# Patient Record
Sex: Female | Born: 1950 | Race: White | Hispanic: No | Smoking: Former smoker
Health system: Southern US, Community
[De-identification: ages and names within clinical notes are randomized; demographics above are authoritative.]

## PROBLEM LIST (undated history)

## (undated) DIAGNOSIS — R42 Dizziness and giddiness: Secondary | ICD-10-CM

## (undated) DIAGNOSIS — B019 Varicella without complication: Secondary | ICD-10-CM

## (undated) DIAGNOSIS — J45909 Unspecified asthma, uncomplicated: Secondary | ICD-10-CM

## (undated) DIAGNOSIS — K259 Gastric ulcer, unspecified as acute or chronic, without hemorrhage or perforation: Secondary | ICD-10-CM

## (undated) DIAGNOSIS — R32 Unspecified urinary incontinence: Secondary | ICD-10-CM

## (undated) DIAGNOSIS — E1140 Type 2 diabetes mellitus with diabetic neuropathy, unspecified: Secondary | ICD-10-CM

## (undated) DIAGNOSIS — I10 Essential (primary) hypertension: Secondary | ICD-10-CM

## (undated) DIAGNOSIS — D510 Vitamin B12 deficiency anemia due to intrinsic factor deficiency: Secondary | ICD-10-CM

## (undated) DIAGNOSIS — Z87442 Personal history of urinary calculi: Secondary | ICD-10-CM

## (undated) DIAGNOSIS — G629 Polyneuropathy, unspecified: Secondary | ICD-10-CM

## (undated) DIAGNOSIS — E785 Hyperlipidemia, unspecified: Secondary | ICD-10-CM

## (undated) DIAGNOSIS — R011 Cardiac murmur, unspecified: Secondary | ICD-10-CM

## (undated) DIAGNOSIS — G43909 Migraine, unspecified, not intractable, without status migrainosus: Secondary | ICD-10-CM

## (undated) DIAGNOSIS — D649 Anemia, unspecified: Secondary | ICD-10-CM

## (undated) DIAGNOSIS — M1990 Unspecified osteoarthritis, unspecified site: Secondary | ICD-10-CM

## (undated) DIAGNOSIS — T753XXA Motion sickness, initial encounter: Secondary | ICD-10-CM

## (undated) DIAGNOSIS — F329 Major depressive disorder, single episode, unspecified: Secondary | ICD-10-CM

## (undated) DIAGNOSIS — N189 Chronic kidney disease, unspecified: Secondary | ICD-10-CM

## (undated) DIAGNOSIS — E039 Hypothyroidism, unspecified: Secondary | ICD-10-CM

## (undated) DIAGNOSIS — F32A Depression, unspecified: Secondary | ICD-10-CM

## (undated) DIAGNOSIS — Z8719 Personal history of other diseases of the digestive system: Secondary | ICD-10-CM

## (undated) DIAGNOSIS — E669 Obesity, unspecified: Secondary | ICD-10-CM

## (undated) DIAGNOSIS — R0989 Other specified symptoms and signs involving the circulatory and respiratory systems: Secondary | ICD-10-CM

## (undated) DIAGNOSIS — K219 Gastro-esophageal reflux disease without esophagitis: Secondary | ICD-10-CM

## (undated) DIAGNOSIS — G709 Myoneural disorder, unspecified: Secondary | ICD-10-CM

## (undated) HISTORY — DX: Hypothyroidism, unspecified: E03.9

## (undated) HISTORY — DX: Varicella without complication: B01.9

## (undated) HISTORY — DX: Hyperlipidemia, unspecified: E78.5

## (undated) HISTORY — DX: Gastro-esophageal reflux disease without esophagitis: K21.9

## (undated) HISTORY — DX: Essential (primary) hypertension: I10

## (undated) HISTORY — DX: Unspecified urinary incontinence: R32

## (undated) HISTORY — PX: TUBAL LIGATION: SHX77

## (undated) HISTORY — DX: Major depressive disorder, single episode, unspecified: F32.9

## (undated) HISTORY — DX: Cardiac murmur, unspecified: R01.1

## (undated) HISTORY — DX: Type 2 diabetes mellitus with diabetic neuropathy, unspecified: E11.40

## (undated) HISTORY — DX: Migraine, unspecified, not intractable, without status migrainosus: G43.909

## (undated) HISTORY — DX: Depression, unspecified: F32.A

## (undated) HISTORY — DX: Chronic kidney disease, unspecified: N18.9

## (undated) HISTORY — PX: FOOT SURGERY: SHX648

---

## 2000-07-29 ENCOUNTER — Ambulatory Visit (HOSPITAL_COMMUNITY): Admission: RE | Admit: 2000-07-29 | Discharge: 2000-07-29 | Payer: Self-pay | Admitting: Family Medicine

## 2002-04-29 DIAGNOSIS — L02212 Cutaneous abscess of back [any part, except buttock]: Secondary | ICD-10-CM

## 2002-04-29 HISTORY — DX: Cutaneous abscess of back (any part, except buttock): L02.212

## 2009-01-02 ENCOUNTER — Emergency Department (HOSPITAL_COMMUNITY): Admission: EM | Admit: 2009-01-02 | Discharge: 2009-01-02 | Payer: Self-pay | Admitting: Family Medicine

## 2009-01-07 ENCOUNTER — Inpatient Hospital Stay (HOSPITAL_COMMUNITY): Admission: EM | Admit: 2009-01-07 | Discharge: 2009-01-10 | Payer: Self-pay | Admitting: Emergency Medicine

## 2009-01-08 ENCOUNTER — Encounter (INDEPENDENT_AMBULATORY_CARE_PROVIDER_SITE_OTHER): Payer: Self-pay | Admitting: Internal Medicine

## 2010-08-03 LAB — CLOSTRIDIUM DIFFICILE EIA
C difficile Toxins A+B, EIA: NEGATIVE
C difficile Toxins A+B, EIA: NEGATIVE
C difficile Toxins A+B, EIA: NEGATIVE

## 2010-08-03 LAB — GLUCOSE, CAPILLARY
Glucose-Capillary: 123 mg/dL — ABNORMAL HIGH (ref 70–99)
Glucose-Capillary: 140 mg/dL — ABNORMAL HIGH (ref 70–99)
Glucose-Capillary: 142 mg/dL — ABNORMAL HIGH (ref 70–99)
Glucose-Capillary: 152 mg/dL — ABNORMAL HIGH (ref 70–99)
Glucose-Capillary: 153 mg/dL — ABNORMAL HIGH (ref 70–99)
Glucose-Capillary: 166 mg/dL — ABNORMAL HIGH (ref 70–99)
Glucose-Capillary: 181 mg/dL — ABNORMAL HIGH (ref 70–99)
Glucose-Capillary: 185 mg/dL — ABNORMAL HIGH (ref 70–99)
Glucose-Capillary: 187 mg/dL — ABNORMAL HIGH (ref 70–99)
Glucose-Capillary: 195 mg/dL — ABNORMAL HIGH (ref 70–99)
Glucose-Capillary: 225 mg/dL — ABNORMAL HIGH (ref 70–99)
Glucose-Capillary: 267 mg/dL — ABNORMAL HIGH (ref 70–99)
Glucose-Capillary: 358 mg/dL — ABNORMAL HIGH (ref 70–99)
Glucose-Capillary: 66 mg/dL — ABNORMAL LOW (ref 70–99)
Glucose-Capillary: 73 mg/dL (ref 70–99)
Glucose-Capillary: 81 mg/dL (ref 70–99)
Glucose-Capillary: 88 mg/dL (ref 70–99)

## 2010-08-03 LAB — POCT URINALYSIS DIP (DEVICE)
Glucose, UA: 250 mg/dL — AB
Ketones, ur: 15 mg/dL — AB
Nitrite: NEGATIVE
Protein, ur: >=300 mg/dL — AB
Specific Gravity, Urine: >=1.03 (ref 1.005–1.030)
Urobilinogen, UA: 0.2 mg/dL (ref 0.0–1.0)
pH: 5 (ref 5.0–8.0)

## 2010-08-03 LAB — COMPREHENSIVE METABOLIC PANEL
ALT: 18 U/L (ref 0–35)
AST: 49 U/L — ABNORMAL HIGH (ref 0–37)
Albumin: 3.5 g/dL (ref 3.5–5.2)
Alkaline Phosphatase: 75 U/L (ref 39–117)
BUN: 66 mg/dL — ABNORMAL HIGH (ref 6–23)
CO2: 17 mEq/L — ABNORMAL LOW (ref 19–32)
Calcium: 10 mg/dL (ref 8.4–10.5)
Chloride: 91 mEq/L — ABNORMAL LOW (ref 96–112)
Creatinine, Ser: 7.25 mg/dL — ABNORMAL HIGH (ref 0.4–1.2)
GFR calc Af Amer: 7 mL/min — ABNORMAL LOW (ref >60)
GFR calc non Af Amer: 6 mL/min — ABNORMAL LOW (ref >60)
Glucose, Bld: 331 mg/dL — ABNORMAL HIGH (ref 70–99)
Potassium: 4.8 mEq/L (ref 3.5–5.1)
Sodium: 129 mEq/L — ABNORMAL LOW (ref 135–145)
Total Bilirubin: 2.4 mg/dL — ABNORMAL HIGH (ref 0.3–1.2)
Total Protein: 7.1 g/dL (ref 6.0–8.3)

## 2010-08-03 LAB — POCT I-STAT 3, ART BLOOD GAS (G3+)
Acid-base deficit: 10 mmol/L — ABNORMAL HIGH (ref 0.0–2.0)
Bicarbonate: 16.2 mEq/L — ABNORMAL LOW (ref 20.0–24.0)
O2 Saturation: 95 %
Patient temperature: 23
Pressure control: 1 cmH2O
TCO2: 17 mmol/L (ref 0–100)
pCO2 arterial: 19.2 mmHg — CL (ref 35.0–45.0)
pH, Arterial: 7.46 — ABNORMAL HIGH (ref 7.350–7.400)
pO2, Arterial: 36 mmHg — CL (ref 80.0–100.0)

## 2010-08-03 LAB — CULTURE, BLOOD (ROUTINE X 2)
Culture: NO GROWTH
Culture: NO GROWTH

## 2010-08-03 LAB — HEMOCCULT GUIAC POC 1CARD (OFFICE)
Fecal Occult Bld: POSITIVE
Fecal Occult Bld: POSITIVE

## 2010-08-03 LAB — FECAL LACTOFERRIN, QUANT: Fecal Lactoferrin: POSITIVE

## 2010-08-03 LAB — BASIC METABOLIC PANEL
BUN: 13 mg/dL (ref 6–23)
BUN: 28 mg/dL — ABNORMAL HIGH (ref 6–23)
BUN: 57 mg/dL — ABNORMAL HIGH (ref 6–23)
BUN: 61 mg/dL — ABNORMAL HIGH (ref 6–23)
CO2: 17 mEq/L — ABNORMAL LOW (ref 19–32)
CO2: 17 mEq/L — ABNORMAL LOW (ref 19–32)
CO2: 17 mEq/L — ABNORMAL LOW (ref 19–32)
CO2: 19 mEq/L (ref 19–32)
Calcium: 7.2 mg/dL — ABNORMAL LOW (ref 8.4–10.5)
Calcium: 7.6 mg/dL — ABNORMAL LOW (ref 8.4–10.5)
Calcium: 8.1 mg/dL — ABNORMAL LOW (ref 8.4–10.5)
Calcium: 8.5 mg/dL (ref 8.4–10.5)
Chloride: 102 mEq/L (ref 96–112)
Chloride: 106 mEq/L (ref 96–112)
Chloride: 116 mEq/L — ABNORMAL HIGH (ref 96–112)
Chloride: 118 mEq/L — ABNORMAL HIGH (ref 96–112)
Creatinine, Ser: 1.1 mg/dL (ref 0.4–1.2)
Creatinine, Ser: 1.61 mg/dL — ABNORMAL HIGH (ref 0.4–1.2)
Creatinine, Ser: 5.47 mg/dL — ABNORMAL HIGH (ref 0.4–1.2)
Creatinine, Ser: 6.68 mg/dL — ABNORMAL HIGH (ref 0.4–1.2)
GFR calc Af Amer: 10 mL/min — ABNORMAL LOW (ref >60)
GFR calc Af Amer: 40 mL/min — ABNORMAL LOW (ref >60)
GFR calc Af Amer: 8 mL/min — ABNORMAL LOW (ref >60)
GFR calc Af Amer: >60 mL/min (ref >60)
GFR calc non Af Amer: 33 mL/min — ABNORMAL LOW (ref >60)
GFR calc non Af Amer: 51 mL/min — ABNORMAL LOW (ref >60)
GFR calc non Af Amer: 6 mL/min — ABNORMAL LOW (ref >60)
GFR calc non Af Amer: 8 mL/min — ABNORMAL LOW (ref >60)
Glucose, Bld: 109 mg/dL — ABNORMAL HIGH (ref 70–99)
Glucose, Bld: 157 mg/dL — ABNORMAL HIGH (ref 70–99)
Glucose, Bld: 174 mg/dL — ABNORMAL HIGH (ref 70–99)
Glucose, Bld: 205 mg/dL — ABNORMAL HIGH (ref 70–99)
Potassium: 3.1 mEq/L — ABNORMAL LOW (ref 3.5–5.1)
Potassium: 3.3 mEq/L — ABNORMAL LOW (ref 3.5–5.1)
Potassium: 3.8 mEq/L (ref 3.5–5.1)
Potassium: 3.8 mEq/L (ref 3.5–5.1)
Sodium: 134 mEq/L — ABNORMAL LOW (ref 135–145)
Sodium: 138 mEq/L (ref 135–145)
Sodium: 139 mEq/L (ref 135–145)
Sodium: 141 mEq/L (ref 135–145)

## 2010-08-03 LAB — CBC
HCT: 26.8 % — ABNORMAL LOW (ref 36.0–46.0)
HCT: 29.4 % — ABNORMAL LOW (ref 36.0–46.0)
HCT: 41.8 % (ref 36.0–46.0)
Hemoglobin: 10 g/dL — ABNORMAL LOW (ref 12.0–15.0)
Hemoglobin: 14.4 g/dL (ref 12.0–15.0)
Hemoglobin: 9.1 g/dL — ABNORMAL LOW (ref 12.0–15.0)
MCHC: 34 g/dL (ref 30.0–36.0)
MCHC: 34.1 g/dL (ref 30.0–36.0)
MCHC: 34.4 g/dL (ref 30.0–36.0)
MCV: 90.2 fL (ref 78.0–100.0)
MCV: 91.8 fL (ref 78.0–100.0)
MCV: 92.2 fL (ref 78.0–100.0)
Platelets: 169 10*3/uL (ref 150–400)
Platelets: 178 10*3/uL (ref 150–400)
Platelets: 324 10*3/uL (ref 150–400)
RBC: 2.91 MIL/uL — ABNORMAL LOW (ref 3.87–5.11)
RBC: 3.21 MIL/uL — ABNORMAL LOW (ref 3.87–5.11)
RBC: 4.64 MIL/uL (ref 3.87–5.11)
RDW: 14.5 % (ref 11.5–15.5)
RDW: 14.6 % (ref 11.5–15.5)
RDW: 14.6 % (ref 11.5–15.5)
WBC: 16.6 10*3/uL — ABNORMAL HIGH (ref 4.0–10.5)
WBC: 6.1 10*3/uL (ref 4.0–10.5)
WBC: 7.8 10*3/uL (ref 4.0–10.5)

## 2010-08-03 LAB — POCT CARDIAC MARKERS
CKMB, poc: 1.2 ng/mL (ref 1.0–8.0)
CKMB, poc: 4.2 ng/mL (ref 1.0–8.0)
Myoglobin, poc: 273 ng/mL (ref 12–200)
Myoglobin, poc: >500 ng/mL (ref 12–200)
Troponin i, poc: <0.05 ng/mL (ref 0.00–0.09)
Troponin i, poc: <0.05 ng/mL (ref 0.00–0.09)

## 2010-08-03 LAB — DIFFERENTIAL
Basophils Absolute: 0 10*3/uL (ref 0.0–0.1)
Basophils Relative: 0 % (ref 0–1)
Eosinophils Absolute: 0 10*3/uL (ref 0.0–0.7)
Eosinophils Relative: 0 % (ref 0–5)
Lymphocytes Relative: 13 % (ref 12–46)
Lymphs Abs: 2.1 10*3/uL (ref 0.7–4.0)
Monocytes Absolute: 0.4 10*3/uL (ref 0.1–1.0)
Monocytes Relative: 2 % — ABNORMAL LOW (ref 3–12)
Neutro Abs: 14 10*3/uL — ABNORMAL HIGH (ref 1.7–7.7)
Neutrophils Relative %: 85 % — ABNORMAL HIGH (ref 43–77)

## 2010-08-03 LAB — URINALYSIS, ROUTINE W REFLEX MICROSCOPIC
Bilirubin Urine: NEGATIVE
Glucose, UA: NEGATIVE mg/dL
Glucose, UA: NEGATIVE mg/dL
Hgb urine dipstick: NEGATIVE
Hgb urine dipstick: NEGATIVE
Ketones, ur: 15 mg/dL — AB
Ketones, ur: NEGATIVE mg/dL
Leukocytes, UA: NEGATIVE
Nitrite: NEGATIVE
Nitrite: NEGATIVE
Protein, ur: 30 mg/dL — AB
Protein, ur: NEGATIVE mg/dL
Specific Gravity, Urine: 1.012 (ref 1.005–1.030)
Specific Gravity, Urine: 1.03 (ref 1.005–1.030)
Urobilinogen, UA: 0.2 mg/dL (ref 0.0–1.0)
Urobilinogen, UA: 0.2 mg/dL (ref 0.0–1.0)
pH: 5 (ref 5.0–8.0)
pH: 6 (ref 5.0–8.0)

## 2010-08-03 LAB — PATHOLOGIST SMEAR REVIEW

## 2010-08-03 LAB — OSMOLALITY: Osmolality: 278 mOsm/kg (ref 275–300)

## 2010-08-03 LAB — URINE CULTURE
Colony Count: NO GROWTH
Colony Count: NO GROWTH
Culture: NO GROWTH
Culture: NO GROWTH
Special Requests: NEGATIVE

## 2010-08-03 LAB — PROTIME-INR
INR: 1.2 (ref 0.00–1.49)
Prothrombin Time: 15 seconds (ref 11.6–15.2)

## 2010-08-03 LAB — POCT I-STAT, CHEM 8
BUN: 40 mg/dL — ABNORMAL HIGH (ref 6–23)
Calcium, Ion: 1.44 mmol/L — ABNORMAL HIGH (ref 1.12–1.32)
Chloride: 100 mEq/L (ref 96–112)
Creatinine, Ser: 1.6 mg/dL — ABNORMAL HIGH (ref 0.4–1.2)
Glucose, Bld: 354 mg/dL — ABNORMAL HIGH (ref 70–99)
HCT: 46 % (ref 36.0–46.0)
Hemoglobin: 15.6 g/dL — ABNORMAL HIGH (ref 12.0–15.0)
Potassium: 3.4 mEq/L — ABNORMAL LOW (ref 3.5–5.1)
Sodium: 130 mEq/L — ABNORMAL LOW (ref 135–145)
TCO2: 22 mmol/L (ref 0–100)

## 2010-08-03 LAB — STOOL CULTURE

## 2010-08-03 LAB — PHOSPHORUS
Phosphorus: 4.7 mg/dL — ABNORMAL HIGH (ref 2.3–4.6)
Phosphorus: 5.7 mg/dL — ABNORMAL HIGH (ref 2.3–4.6)

## 2010-08-03 LAB — KETONES, QUALITATIVE: Acetone, Bld: NEGATIVE

## 2010-08-03 LAB — URINE MICROSCOPIC-ADD ON

## 2010-08-03 LAB — BRAIN NATRIURETIC PEPTIDE: Pro B Natriuretic peptide (BNP): 292 pg/mL — ABNORMAL HIGH (ref 0.0–100.0)

## 2010-08-03 LAB — OSMOLALITY, URINE: Osmolality, Ur: 295 mOsm/kg — ABNORMAL LOW (ref 390–1090)

## 2010-08-03 LAB — MAGNESIUM
Magnesium: 1.4 mg/dL — ABNORMAL LOW (ref 1.5–2.5)
Magnesium: 2.2 mg/dL (ref 1.5–2.5)
Magnesium: 2.2 mg/dL (ref 1.5–2.5)

## 2010-08-03 LAB — CHLORIDE, URINE, RANDOM: Chloride Urine: 32 mEq/L

## 2010-08-03 LAB — SAVE SMEAR

## 2010-08-03 LAB — FRACTIONAL EXCRET-NA(24HR UR +SERUM)
Creatinine, Ser: 5.56 mg/dL — ABNORMAL HIGH (ref 0.4–1.2)
Creatinine, Urine: 265.55 mg/dL
Fractional Excretion of Na: <1 %
Sodium, Ur: 17 mEq/L
Sodium: 138 mEq/L (ref 135–145)

## 2010-08-03 LAB — APTT: aPTT: 25 seconds (ref 24–37)

## 2010-08-03 LAB — SODIUM, URINE, RANDOM: Sodium, Ur: 17 mEq/L

## 2010-08-03 LAB — CK: Total CK: 145 U/L (ref 7–177)

## 2011-11-04 ENCOUNTER — Encounter (HOSPITAL_BASED_OUTPATIENT_CLINIC_OR_DEPARTMENT_OTHER): Payer: Self-pay | Admitting: *Deleted

## 2011-11-04 ENCOUNTER — Emergency Department (HOSPITAL_BASED_OUTPATIENT_CLINIC_OR_DEPARTMENT_OTHER): Payer: Medicare Other

## 2011-11-04 ENCOUNTER — Emergency Department (HOSPITAL_BASED_OUTPATIENT_CLINIC_OR_DEPARTMENT_OTHER)
Admission: EM | Admit: 2011-11-04 | Discharge: 2011-11-04 | Disposition: A | Discharge status: Home / Self Care | Payer: Medicare Other | Attending: Emergency Medicine | Admitting: Emergency Medicine

## 2011-11-04 DIAGNOSIS — M25519 Pain in unspecified shoulder: Secondary | ICD-10-CM | POA: Insufficient documentation

## 2011-11-04 DIAGNOSIS — E11649 Type 2 diabetes mellitus with hypoglycemia without coma: Secondary | ICD-10-CM

## 2011-11-04 DIAGNOSIS — S43014A Anterior dislocation of right humerus, initial encounter: Secondary | ICD-10-CM

## 2011-11-04 DIAGNOSIS — Z79899 Other long term (current) drug therapy: Secondary | ICD-10-CM | POA: Insufficient documentation

## 2011-11-04 DIAGNOSIS — K219 Gastro-esophageal reflux disease without esophagitis: Secondary | ICD-10-CM | POA: Insufficient documentation

## 2011-11-04 DIAGNOSIS — J45909 Unspecified asthma, uncomplicated: Secondary | ICD-10-CM | POA: Insufficient documentation

## 2011-11-04 DIAGNOSIS — Z794 Long term (current) use of insulin: Secondary | ICD-10-CM | POA: Insufficient documentation

## 2011-11-04 DIAGNOSIS — S43016A Anterior dislocation of unspecified humerus, initial encounter: Secondary | ICD-10-CM | POA: Insufficient documentation

## 2011-11-04 DIAGNOSIS — R6883 Chills (without fever): Secondary | ICD-10-CM | POA: Insufficient documentation

## 2011-11-04 DIAGNOSIS — W010XXA Fall on same level from slipping, tripping and stumbling without subsequent striking against object, initial encounter: Secondary | ICD-10-CM | POA: Insufficient documentation

## 2011-11-04 DIAGNOSIS — S4980XA Other specified injuries of shoulder and upper arm, unspecified arm, initial encounter: Secondary | ICD-10-CM | POA: Insufficient documentation

## 2011-11-04 DIAGNOSIS — S46909A Unspecified injury of unspecified muscle, fascia and tendon at shoulder and upper arm level, unspecified arm, initial encounter: Secondary | ICD-10-CM | POA: Insufficient documentation

## 2011-11-04 DIAGNOSIS — M2180 Other specified acquired deformities of unspecified limb: Secondary | ICD-10-CM | POA: Insufficient documentation

## 2011-11-04 DIAGNOSIS — S43006A Unspecified dislocation of unspecified shoulder joint, initial encounter: Secondary | ICD-10-CM | POA: Insufficient documentation

## 2011-11-04 DIAGNOSIS — E1169 Type 2 diabetes mellitus with other specified complication: Secondary | ICD-10-CM | POA: Insufficient documentation

## 2011-11-04 DIAGNOSIS — M25419 Effusion, unspecified shoulder: Secondary | ICD-10-CM | POA: Insufficient documentation

## 2011-11-04 HISTORY — DX: Unspecified asthma, uncomplicated: J45.909

## 2011-11-04 HISTORY — DX: Polyneuropathy, unspecified: G62.9

## 2011-11-04 LAB — COMPREHENSIVE METABOLIC PANEL
ALT: 13 U/L (ref 0–35)
AST: 20 U/L (ref 0–37)
Albumin: 3.7 g/dL (ref 3.5–5.2)
Alkaline Phosphatase: 85 U/L (ref 39–117)
BUN: 40 mg/dL — ABNORMAL HIGH (ref 6–23)
CO2: 23 mEq/L (ref 19–32)
Calcium: 9.5 mg/dL (ref 8.4–10.5)
Chloride: 103 mEq/L (ref 96–112)
Creatinine, Ser: 1.4 mg/dL — ABNORMAL HIGH (ref 0.50–1.10)
GFR calc Af Amer: 47 mL/min — ABNORMAL LOW (ref >90)
GFR calc non Af Amer: 40 mL/min — ABNORMAL LOW (ref >90)
Glucose, Bld: 91 mg/dL (ref 70–99)
Potassium: 5.4 mEq/L — ABNORMAL HIGH (ref 3.5–5.1)
Sodium: 137 mEq/L (ref 135–145)
Total Bilirubin: 0.1 mg/dL — ABNORMAL LOW (ref 0.3–1.2)
Total Protein: 7.3 g/dL (ref 6.0–8.3)

## 2011-11-04 LAB — CBC
HCT: 33.4 % — ABNORMAL LOW (ref 36.0–46.0)
Hemoglobin: 11 g/dL — ABNORMAL LOW (ref 12.0–15.0)
MCH: 28.7 pg (ref 26.0–34.0)
MCHC: 32.9 g/dL (ref 30.0–36.0)
MCV: 87.2 fL (ref 78.0–100.0)
Platelets: 244 10*3/uL (ref 150–400)
RBC: 3.83 MIL/uL — ABNORMAL LOW (ref 3.87–5.11)
RDW: 14.6 % (ref 11.5–15.5)
WBC: 8.3 10*3/uL (ref 4.0–10.5)

## 2011-11-04 LAB — POTASSIUM: Potassium: 4.3 mEq/L (ref 3.5–5.1)

## 2011-11-04 LAB — GLUCOSE, CAPILLARY
Glucose-Capillary: 76 mg/dL (ref 70–99)
Glucose-Capillary: 127 mg/dL — ABNORMAL HIGH (ref 70–99)

## 2011-11-04 MED ORDER — PROPOFOL 10 MG/ML IV BOLUS
1.0000 mg/kg | Freq: Once | INTRAVENOUS | Status: AC
  Administered 2011-11-04: 119.3 mg via INTRAVENOUS
  Filled 2011-11-04: qty 20
  Filled 2011-11-04: qty 11.93

## 2011-11-04 MED ORDER — SODIUM CHLORIDE 0.9 % IV BOLUS (SEPSIS)
500.0000 mL | Freq: Once | INTRAVENOUS | Status: AC
  Administered 2011-11-04: 500 mL via INTRAVENOUS

## 2011-11-04 MED ORDER — OXYCODONE-ACETAMINOPHEN 5-325 MG PO TABS: 1.0000 | ORAL_TABLET | Freq: Four times a day (QID) | ORAL | Status: AC | PRN

## 2011-11-04 MED ORDER — PROPOFOL 10 MG/ML IV BOLUS
INTRAVENOUS | Status: AC | PRN
  Administered 2011-11-04: 119.3 mg via INTRAVENOUS
  Administered 2011-11-04: 60 mg via INTRAVENOUS

## 2011-11-04 NOTE — ED Provider Notes (Signed)
History       CSN: [id number]  Arrival date & time 11/04/11  8295   First MD Initiated Contact with Patient 11/04/11 1015      Chief Complaint  Patient presents with  . Shoulder Injury    right    (Consider location/radiation/quality/duration/timing/severity/associated sxs/prior treatment) Patient is a 61 y.o. female presenting with shoulder injury. The history is provided by the patient.  Shoulder Injury Associated symptoms include chills. Pertinent negatives include no chest pain or fever.  She states she woke up feeling shaky and cold, measured her blood sugar at 46, took glucose pills and milk, and began to feel better. She then got up to go to the bathroom and fell sideways with her R shoulder hitting the door way on the way down. She denies losing consciousness. She did not injure any other part of her body. She denies numbness in her R hand though she feels like it 'falls asleep' at times until she movies it. She feels this is because of the sling that she was given at the urgent care facility.   She has been having low blood sugars in the early morning (3-4 am) during the past week with measurements as low as 38. Morning sugars 100-150 she reports. She denies taking additional insulin or oral hypoglycemic agents, but she does state she has been doing more activities around the house than usual and eating more salads and vegetables recently.  Past Medical History  Diagnosis Date  . Diabetes mellitus   . Asthma   . Reflux   . Neuropathy   . Abscess of back     Past Surgical History  Procedure Date  . Tubal ligation   . Foot surgery     5 on right foot and 2 on left     No family history on file.  History  Substance Use Topics  . Smoking status: Former Games developer  . Smokeless tobacco: Not on file  . Alcohol Use: No    OB History    Grav Para Term Preterm Abortions TAB SAB Ect Mult Living                  Review of Systems  Constitutional: Positive for chills  and activity change. Negative for fever.  Cardiovascular: Negative for chest pain.  All other systems reviewed and are negative.    Allergies  Review of patient's allergies indicates no known allergies.  Home Medications   Current Outpatient Rx  Name Route Sig Dispense Refill  . ESOMEPRAZOLE MAGNESIUM 10 MG PO PACK Oral Take 10 mg by mouth daily before breakfast.    . GABAPENTIN 800 MG PO TABS Oral Take 800 mg by mouth 3 (three) times daily.    . INSULIN ISOPHANE & REGULAR (70-30) 100 UNIT/ML [place] SUSP Subcutaneous Inject into the skin.    Marland Kitchen LISINOPRIL 40 MG PO TABS Oral Take 40 mg by mouth daily.    Marland Kitchen LOVASTATIN 10 MG PO TABS Oral Take 10 mg by mouth at bedtime.    Marland Kitchen SITAGLIPTIN PHOSPHATE 100 MG PO TABS Oral Take 100 mg by mouth daily.      BP 151/61  Pulse 88  Temp 98.1 F (36.7 C) (Oral)  Resp 20  Ht 5\' 4"  (1.626 m)  Wt 263 lb (119.296 kg)  BMI 45.14 kg/m2  SpO2 100%  Physical Exam  HENT:  Head: Normocephalic and atraumatic.  Cardiovascular: Regular rhythm and normal heart sounds.   Pulmonary/Chest: Effort normal and breath sounds  normal. She has no wheezes.  Abdominal: Soft. Bowel sounds are normal. There is no tenderness.  Musculoskeletal:       Right shoulder: She exhibits decreased range of motion, tenderness, swelling, deformity and pain.       R shoulder glenohumeral joint deformity with anterior bulging. No A/C joint tenderness or clavicular deformity. Sensation and grip strength normal on R arm. Normal sensation in axillary nerve distribution over lateral arm. Decreased active and passive ROM. 2+ radial pulse, symmetric.  Neurological: She is alert.  Skin: She is not diaphoretic.    ED Course  Reduction of dislocation Date/Time: 11/04/2011 11:47 AM Performed by: Larey Seat Authorized by: Forbes Cellar Consent: Written consent obtained. Risks and benefits: risks, benefits and alternatives were discussed Consent given by: patient Patient  understanding: patient states understanding of the procedure being performed Patient consent: the patient's understanding of the procedure matches consent given Procedure consent: procedure consent matches procedure scheduled Relevant documents: relevant documents present and verified Imaging studies: imaging studies available Patient identity confirmed: verbally with patient and provided demographic data Time out: Immediately prior to procedure a "time out" was called to verify the correct patient, procedure, equipment, support staff and site/side marked as required. Patient sedated: yes Sedation type: moderate (conscious) sedation Sedatives: propofol Vitals: Vital signs were monitored during sedation. Patient tolerance: Patient tolerated the procedure well with no immediate complications. Comments: R shoulder dislocation reduction   (including critical care time)   Labs Reviewed  GLUCOSE, CAPILLARY   Dg Shoulder Right  11/04/2011  *RADIOLOGY REPORT*  Clinical Data: Injury, pain.  RIGHT SHOULDER - 2+ VIEW  Comparison: None.  Findings: There is anterior dislocation of the right humerus. There may be a small chip fracture off the inferior aspect of the glenoid bone. Small calcified fragments are also seen off the greater tuberosity likely due to the rotator cuff disease.  The acromioclavicular joint is intact with some degenerative change noted.  Imaged right lung and ribs are clear.  IMPRESSION:  1.  Anterior right shoulder dislocation with possible bony Bankart. 2.  Small calcifications of the greater tuberosity are likely due to calcific rotator cuff tendinopathy.  Original Report Authenticated By: Bernadene Bell. D'ALESSIO, M.D.   Dg Shoulder Right Port  11/04/2011  *RADIOLOGY REPORT*  Clinical Data: Shoulder dislocation post reduction  PORTABLE RIGHT SHOULDER - 2+ VIEW  Comparison: 72,013  Findings: Previously identified inferior glenohumeral dislocation appears reduced on AP-only views. No  definite fracture identified. Minimal widening of the [place] joint. Visualized right ribs appear intact.  IMPRESSION: Apparent reduction of glenohumeral dislocation. Slight widening of the AC joint.  Original Report Authenticated By: Lollie Marrow, M.D.     No diagnosis found.    MDM   Right anterior glenohumeral dislocation with small Bankart fracture on XRay. Patient underwent successful dislocation reduction with repeat imaging after procedure. No numbness or loss of sensation or grip strength in R arm after procedure. For history of hypoglycemia, will recommend no diabetes medication today and have her son who lives with her provide continued observation. Hypoglycemia has not been an issue here during her ED stay. Pt has appointment with PCP coming up. Patient counseled on the signs of neurovascular compromise and advised to return to ED and seek medical attention if loss of sensation or strength in R arm or if pain worsens. Will give PO pain medication. Patient advised to stay in sling until PCP or ortho follow up.  F/u with ortho.  Larey Seat, MD  11/04/11 1510 

## 2011-11-04 NOTE — ED Notes (Signed)
Patient states her blood sugar has been dropping in the middle of the night.  This morning her blood sugar was 39, took glucose tabs and drank some milk, when she stood up she became lightheaded and fell.  Landed on her right shoulder.  Xray at [facility] showed dislocation and possible fracture of her right shoulder.  Patient undress and shoulder immobilizer placed on pt on arrival to room.

## 2011-11-06 NOTE — ED Provider Notes (Signed)
I saw and evaluated the patient, reviewed the resident's note and I agree with the findings and plan, supervised dislocation reduction.   Fall after episode of hypoglycemia. Has been frequent in past one week with 3 occurences. She does taken an oral hypoglycemic in addition to insulin. Nl eating habits. Fall resulting in R shoulder fx and dislocation. Reduced in ED. Monitored without hypoglycemia in ED. States that her sister is going to stay with her and that she lives with her son. She does not wish to be admitted for obs. Plans to hold diabetic medications, call PMD tomorrow. F/U ortho  Forbes Cellar, MD 11/06/11 0900

## 2011-12-02 ENCOUNTER — Ambulatory Visit: Payer: Medicare Other | Attending: Orthopedic Surgery | Admitting: Rehabilitation

## 2011-12-02 DIAGNOSIS — IMO0002 Reserved for concepts with insufficient information to code with codable children: Secondary | ICD-10-CM | POA: Insufficient documentation

## 2011-12-02 DIAGNOSIS — W19XXXS Unspecified fall, sequela: Secondary | ICD-10-CM | POA: Insufficient documentation

## 2011-12-02 DIAGNOSIS — IMO0001 Reserved for inherently not codable concepts without codable children: Secondary | ICD-10-CM | POA: Insufficient documentation

## 2011-12-02 DIAGNOSIS — M25519 Pain in unspecified shoulder: Secondary | ICD-10-CM | POA: Insufficient documentation

## 2011-12-05 ENCOUNTER — Ambulatory Visit: Payer: Medicare Other | Admitting: Rehabilitation

## 2011-12-09 ENCOUNTER — Ambulatory Visit: Payer: Medicare Other | Admitting: Rehabilitation

## 2011-12-09 ENCOUNTER — Encounter: Payer: Medicare Other | Admitting: Rehabilitation

## 2011-12-12 ENCOUNTER — Ambulatory Visit: Payer: Medicare Other | Admitting: Rehabilitation

## 2011-12-16 ENCOUNTER — Ambulatory Visit: Payer: Medicare Other | Admitting: Rehabilitation

## 2011-12-19 ENCOUNTER — Ambulatory Visit: Payer: Medicare Other | Admitting: Rehabilitation

## 2015-10-25 ENCOUNTER — Ambulatory Visit (INDEPENDENT_AMBULATORY_CARE_PROVIDER_SITE_OTHER): Payer: Medicare Other | Admitting: Family Medicine

## 2015-10-25 ENCOUNTER — Other Ambulatory Visit: Payer: Self-pay

## 2015-10-25 ENCOUNTER — Encounter: Payer: Self-pay | Admitting: Family Medicine

## 2015-10-25 VITALS — BP 132/76 | HR 125 | Temp 97.8°F | Ht 63.75 in | Wt 230.0 lb

## 2015-10-25 DIAGNOSIS — E785 Hyperlipidemia, unspecified: Secondary | ICD-10-CM | POA: Diagnosis not present

## 2015-10-25 DIAGNOSIS — Z1239 Encounter for other screening for malignant neoplasm of breast: Secondary | ICD-10-CM

## 2015-10-25 DIAGNOSIS — E1142 Type 2 diabetes mellitus with diabetic polyneuropathy: Secondary | ICD-10-CM

## 2015-10-25 DIAGNOSIS — J4530 Mild persistent asthma, uncomplicated: Secondary | ICD-10-CM

## 2015-10-25 DIAGNOSIS — I10 Essential (primary) hypertension: Secondary | ICD-10-CM

## 2015-10-25 MED ORDER — TETANUS-DIPHTH-ACELL PERTUSSIS 5-2.5-18.5 LF-MCG/0.5 IM SUSP: 0.5000 mL | Freq: Once | INTRAMUSCULAR | Status: DC

## 2015-10-25 MED ORDER — ZOSTER VACCINE LIVE 19400 UNT/0.65ML ~~LOC~~ SUSR: 0.6500 mL | Freq: Once | SUBCUTANEOUS | Status: DC

## 2015-10-25 NOTE — Progress Notes (Signed)
Pre visit review using our clinic review tool, if applicable. No additional management support is needed unless otherwise documented below in the visit note. 

## 2015-10-25 NOTE — Patient Instructions (Addendum)
Continue your current medications.  Follow up later this year for blood work and pap smear.  Get your Tdap and zostavax at the pharmacy.  Take care  Dr. Adriana Simasook

## 2015-10-26 ENCOUNTER — Encounter: Payer: Self-pay | Admitting: Family Medicine

## 2015-10-26 DIAGNOSIS — I10 Essential (primary) hypertension: Secondary | ICD-10-CM | POA: Insufficient documentation

## 2015-10-26 DIAGNOSIS — K219 Gastro-esophageal reflux disease without esophagitis: Secondary | ICD-10-CM | POA: Insufficient documentation

## 2015-10-26 DIAGNOSIS — E039 Hypothyroidism, unspecified: Secondary | ICD-10-CM | POA: Insufficient documentation

## 2015-10-26 DIAGNOSIS — F329 Major depressive disorder, single episode, unspecified: Secondary | ICD-10-CM | POA: Insufficient documentation

## 2015-10-26 DIAGNOSIS — J45909 Unspecified asthma, uncomplicated: Secondary | ICD-10-CM | POA: Insufficient documentation

## 2015-10-26 DIAGNOSIS — E1142 Type 2 diabetes mellitus with diabetic polyneuropathy: Secondary | ICD-10-CM | POA: Insufficient documentation

## 2015-10-26 DIAGNOSIS — N183 Chronic kidney disease, stage 3 unspecified: Secondary | ICD-10-CM | POA: Insufficient documentation

## 2015-10-26 DIAGNOSIS — E785 Hyperlipidemia, unspecified: Secondary | ICD-10-CM | POA: Insufficient documentation

## 2015-10-26 DIAGNOSIS — F32A Depression, unspecified: Secondary | ICD-10-CM | POA: Insufficient documentation

## 2015-10-26 NOTE — Assessment & Plan Note (Signed)
Stable and fairly well controlled on lovastatin. We'll continue. Consider switching to high density statin in the future.

## 2015-10-26 NOTE — Progress Notes (Signed)
Subjective:  Patient ID: Tina Blankenship, female    DOB: 07/23/1950  Age: 65 y.o. MRN: [id number]  CC: Establish care  HPI Tina Blankenship is a 65 y.o. female presents to the clinic today to establish care. Issues/problems are below.   HTN  Stable on Lisinopril/HCTZ.   Hyperlipidemia  Fairly well controlled.  Last LDL in 02/2015 was 78.  She is currently on Lovastatin and endorses compliance (unsure of why she is not on high intensity statin).  DM-2  Decent control but not at goal.  Last A1C was 7.7 (02/2015). Hypoglycemia - Has had hypoglycemia recently.  Medications - Janumet, Humalog 75/25 36 in the am/40 in pm (does not always take as recommended).  Compliance - See above.  Preventative care Eye exam - In need of. Foot exam - Will perform today. Last A1C - See above.  Urine microalbumin - Done 02/2015. Supposed to be on Aspirin. Not taking regularly. On statin.  Asthma  Stable on Flovent and PRN Albuterol.   PMH, Surgical Hx, Family Hx, Social History reviewed and updated as below.  Past Medical History  Diagnosis Date  . Diabetes mellitus   . Asthma   . GERD (gastroesophageal reflux disease)   . Diabetic neuropathy (HCC)   . Depression   . Chicken pox   . Heart murmur   . Hyperlipidemia   . Hypertension   . Chronic kidney disease   . Hypothyroidism   . Migraines   . Urine incontinence    Past Surgical History  Procedure Laterality Date  . Tubal ligation    . Foot surgery     Family History  Problem Relation Age of Onset  . Arthritis Mother   . Heart disease Mother   . Alcohol abuse Father   . Diabetes Father   . Sudden death Brother   . Arthritis Maternal Grandmother   . Heart disease Maternal Grandmother   . Hypertension Maternal Grandmother   . Pancreatic cancer Maternal Grandmother   . Arthritis Maternal Grandfather   . Prostate cancer Maternal Grandfather   . Hyperlipidemia Maternal Grandfather   . Diabetes Paternal Grandmother     . Stroke Paternal Grandfather    Social History  Substance Use Topics  . Smoking status: Former Games developermoker  . Smokeless tobacco: Never Used  . Alcohol Use: No   Review of Systems  HENT: Positive for tinnitus.   Respiratory: Positive for cough.   Gastrointestinal:       Recent nausea, vomiting, diarrhea >> now resolved.   Genitourinary: Positive for frequency.       Urinary incontinence.  Musculoskeletal: Positive for myalgias and arthralgias.  Neurological: Positive for dizziness and numbness.  Psychiatric/Behavioral:       Sadness/anxiety/stress  All other systems reviewed and are negative.  Objective:   Today's Vitals: BP 132/76 mmHg  Pulse 125  Temp(Src) 97.8 F (36.6 C) (Oral)  Ht 5' 3.75" (1.619 m)  Wt 230 lb (104.327 kg)  BMI 39.80 kg/m2  SpO2 95%  Physical Exam  Constitutional: She is oriented to person, place, and time. She appears well-nourished.  Obese female in NAD.   HENT:  Head: Normocephalic and atraumatic.  Mouth/Throat: Oropharynx is clear and moist. No oropharyngeal exudate.  Normal TM's bilaterally.   Eyes: Conjunctivae are normal. No scleral icterus.  Neck: Neck supple.  Cardiovascular: Normal rate and regular rhythm.   2/6 systolic murmur.   Pulmonary/Chest: Effort normal and breath sounds normal. She has no wheezes. She has no rales.  Abdominal: Soft. She exhibits no distension. There is no tenderness. There is no rebound and no guarding.  Musculoskeletal: Normal range of motion. She exhibits no edema.  Neurological: She is alert and oriented to person, place, and time.  Skin: Skin is warm and dry. No rash noted.  Psychiatric: She has a normal mood and affect.  Vitals reviewed.  Assessment & Plan:   Problem List Items Addressed This Visit    DM type 2 with diabetic peripheral neuropathy (HCC)    Uncontrolled. With complications. Patient does not appear to be compliant with her current insulin regimen. She declines lab work today. Will  continue current regimen for now. Will continue to follow.       Relevant Medications   lisinopril-hydrochlorothiazide (PRINZIDE,ZESTORETIC) 20-12.5 MG tablet   Insulin Lispro Prot & Lispro (HUMALOG MIX 75/25 KWIKPEN )   Essential hypertension - Primary    Well-controlled. Continue lisinopril/HCTZ.      Relevant Medications   lisinopril-hydrochlorothiazide (PRINZIDE,ZESTORETIC) 20-12.5 MG tablet   Hyperlipidemia    Stable and fairly well controlled on lovastatin. We'll continue. Consider switching to high density statin in the future.      Relevant Medications   lisinopril-hydrochlorothiazide (PRINZIDE,ZESTORETIC) 20-12.5 MG tablet   Asthma    Well controlled currently. Continue Flovent and PRN Albuterol.        Other Visit Diagnoses    Screening for breast cancer        Relevant Orders    MM DIGITAL SCREENING BILATERAL       Outpatient Encounter Prescriptions as of 10/25/2015  Medication Sig  . albuterol (PROVENTIL HFA) 108 (90 Base) MCG/ACT inhaler Inhale into the lungs.  . cyanocobalamin (,VITAMIN B-12,) 1000 MCG/ML injection 1 mL.  . DULoxetine (CYMBALTA) 60 MG capsule   . esomeprazole (NEXIUM) 40 MG capsule Take by mouth daily.  . fluticasone (FLOVENT HFA) 44 MCG/ACT inhaler Inhale into the lungs.  . gabapentin (NEURONTIN) 800 MG tablet Take 800 mg by mouth 3 (three) times daily.  . Insulin Lispro Prot & Lispro (HUMALOG MIX 75/25 KWIKPEN ) Inject into the skin.  . Insulin Pen Needle (B-D ULTRAFINE III SHORT PEN) 31G X 8 MM MISC   . levothyroxine (SYNTHROID) 50 MCG tablet   . lisinopril-hydrochlorothiazide (PRINZIDE,ZESTORETIC) 20-12.5 MG tablet Take 1 tablet by mouth daily.  Marland Kitchen. lovastatin (MEVACOR) 40 MG tablet   . montelukast (SINGULAIR) 10 MG tablet   . sitaGLIPtin-metformin (JANUMET) 50-1000 MG tablet   . [DISCONTINUED] esomeprazole (NEXIUM) 10 MG packet Take 10 mg by mouth daily before breakfast.  . [DISCONTINUED] lisinopril (PRINIVIL,ZESTRIL) 40 MG tablet  Take 40 mg by mouth daily.  . [DISCONTINUED] lovastatin (MEVACOR) 10 MG tablet Take 10 mg by mouth at bedtime.  . [DISCONTINUED] sitaGLIPtin (JANUVIA) 100 MG tablet Take 100 mg by mouth daily.  . Tdap (BOOSTRIX) 5-2.5-18.5 LF-MCG/0.5 injection Inject 0.5 mLs into the muscle once.  . Zoster Vaccine Live, PF, (ZOSTAVAX) [id number] UNT/0.65ML injection Inject 19,400 Units into the skin once.  . [DISCONTINUED] insulin NPH-insulin regular (NOVOLIN 70/30) (70-30) 100 UNIT/ML injection Inject into the skin. Reported on 10/25/2015   No facility-administered encounter medications on file as of 10/25/2015.    Follow-up: Later this year.  Everlene OtherJayce Muneer Leider DO [facility] [place]

## 2015-10-26 NOTE — Assessment & Plan Note (Signed)
Well controlled. Continue lisinopril/HCTZ. 

## 2015-10-26 NOTE — Assessment & Plan Note (Signed)
Well controlled currently. Continue Flovent and PRN Albuterol.

## 2015-10-26 NOTE — Assessment & Plan Note (Signed)
Uncontrolled. With complications. Patient does not appear to be compliant with her current insulin regimen. She declines lab work today. Will continue current regimen for now. Will continue to follow.

## 2015-11-01 ENCOUNTER — Other Ambulatory Visit: Payer: Self-pay | Admitting: Family Medicine

## 2015-11-01 ENCOUNTER — Telehealth: Payer: Self-pay

## 2015-11-01 DIAGNOSIS — E039 Hypothyroidism, unspecified: Secondary | ICD-10-CM

## 2015-11-01 DIAGNOSIS — D649 Anemia, unspecified: Secondary | ICD-10-CM

## 2015-11-01 DIAGNOSIS — E1142 Type 2 diabetes mellitus with diabetic polyneuropathy: Secondary | ICD-10-CM

## 2015-11-01 DIAGNOSIS — E785 Hyperlipidemia, unspecified: Secondary | ICD-10-CM

## 2015-11-01 DIAGNOSIS — N183 Chronic kidney disease, stage 3 (moderate): Secondary | ICD-10-CM

## 2015-11-01 NOTE — Telephone Encounter (Signed)
Pt coming for labs on 11/03/15. Please place future orders. Thank you.

## 2015-11-03 ENCOUNTER — Other Ambulatory Visit: Payer: Medicare Other

## 2015-11-16 ENCOUNTER — Ambulatory Visit: Payer: Medicare Other | Attending: Family Medicine

## 2015-11-23 ENCOUNTER — Other Ambulatory Visit: Payer: Self-pay | Admitting: Family Medicine

## 2015-11-23 ENCOUNTER — Telehealth: Payer: Self-pay | Admitting: Family Medicine

## 2015-11-23 NOTE — Telephone Encounter (Signed)
Patient requesting refills, you saw for a Establish care visit on 6/28.  Patient also requested humalog, unable to order what is non the chart, mix 75/25 Kwik pen.  Please advise thanks

## 2015-11-23 NOTE — Telephone Encounter (Signed)
Pt called needing refill for Insulin Lispro Prot & Lispro (HUMALOG MIX 75/25 KWIKPEN [place]), Insulin Pen Needle (B-D ULTRAFINE III SHORT PEN) 31G X 8 MM MISC, DULoxetine (CYMBALTA) 60 MG capsule, esomeprazole (NEXIUM) 40 MG capsule, gabapentin (NEURONTIN) 800 MG tablet, levothyroxine (SYNTHROID) 50 MCG tablet, lisinopril-hydrochlorothiazide (PRINZIDE,ZESTORETIC) 20-12.5 MG tablet,lovastatin (MEVACOR) 40 MG tablet, montelukast (SINGULAIR) 10 MG tablet, sitaGLIPtin-metformin (JANUMET) 50-1000 MG tablet,   Pharmacy is [id number] - [place], [place] - [address]   Call pt @ [phone]. Thank you!

## 2015-11-24 MED ORDER — ESOMEPRAZOLE MAGNESIUM 40 MG PO CPDR: 40.0000 mg | DELAYED_RELEASE_CAPSULE | Freq: Every day | ORAL | 3 refills | Status: DC

## 2015-11-24 MED ORDER — SITAGLIPTIN PHOS-METFORMIN HCL 50-1000 MG PO TABS: 1.0000 | ORAL_TABLET | Freq: Two times a day (BID) | ORAL | 0 refills | Status: DC

## 2015-11-24 MED ORDER — GABAPENTIN 800 MG PO TABS: 800.0000 mg | ORAL_TABLET | Freq: Three times a day (TID) | ORAL | 3 refills | Status: DC

## 2015-11-24 MED ORDER — DULOXETINE HCL 60 MG PO CPEP: 60.0000 mg | ORAL_CAPSULE | Freq: Every day | ORAL | 3 refills | Status: DC

## 2015-11-24 MED ORDER — LOVASTATIN 40 MG PO TABS: 40.0000 mg | ORAL_TABLET | Freq: Every day | ORAL | 3 refills | Status: AC

## 2015-11-24 MED ORDER — MONTELUKAST SODIUM 10 MG PO TABS: 10.0000 mg | ORAL_TABLET | Freq: Every day | ORAL | 0 refills | Status: DC

## 2015-11-24 MED ORDER — INSULIN PEN NEEDLE 31G X 8 MM MISC: 11 refills | Status: DC

## 2015-11-24 MED ORDER — LISINOPRIL-HYDROCHLOROTHIAZIDE 20-12.5 MG PO TABS: 1.0000 | ORAL_TABLET | Freq: Every day | ORAL | 3 refills | Status: DC

## 2015-11-24 MED ORDER — LEVOTHYROXINE SODIUM 50 MCG PO TABS: 50.0000 ug | ORAL_TABLET | Freq: Every day | ORAL | 3 refills | Status: DC

## 2015-11-28 ENCOUNTER — Other Ambulatory Visit (INDEPENDENT_AMBULATORY_CARE_PROVIDER_SITE_OTHER): Payer: Medicare Other

## 2015-11-28 DIAGNOSIS — N183 Chronic kidney disease, stage 3 (moderate): Secondary | ICD-10-CM

## 2015-11-28 DIAGNOSIS — D649 Anemia, unspecified: Secondary | ICD-10-CM

## 2015-11-28 DIAGNOSIS — E039 Hypothyroidism, unspecified: Secondary | ICD-10-CM

## 2015-11-28 DIAGNOSIS — E1142 Type 2 diabetes mellitus with diabetic polyneuropathy: Secondary | ICD-10-CM

## 2015-11-28 DIAGNOSIS — E785 Hyperlipidemia, unspecified: Secondary | ICD-10-CM

## 2015-11-28 LAB — COMPREHENSIVE METABOLIC PANEL
ALT: 16 U/L (ref 0–35)
AST: 19 U/L (ref 0–37)
Albumin: 4.2 g/dL (ref 3.5–5.2)
Alkaline Phosphatase: 73 U/L (ref 39–117)
BUN: 21 mg/dL (ref 6–23)
CO2: 26 mEq/L (ref 19–32)
Calcium: 10.4 mg/dL (ref 8.4–10.5)
Chloride: 105 mEq/L (ref 96–112)
Creatinine, Ser: 1.01 mg/dL (ref 0.40–1.20)
GFR: 58.72 mL/min — ABNORMAL LOW (ref >60.00)
Glucose, Bld: 119 mg/dL — ABNORMAL HIGH (ref 70–99)
Potassium: 4.5 mEq/L (ref 3.5–5.1)
Sodium: 140 mEq/L (ref 135–145)
Total Bilirubin: 0.3 mg/dL (ref 0.2–1.2)
Total Protein: 7.7 g/dL (ref 6.0–8.3)

## 2015-11-28 LAB — CBC
HCT: 37.6 % (ref 36.0–46.0)
Hemoglobin: 12.5 g/dL (ref 12.0–15.0)
MCHC: 33.3 g/dL (ref 30.0–36.0)
MCV: 89.6 fl (ref 78.0–100.0)
Platelets: 329 10*3/uL (ref 150.0–400.0)
RBC: 4.2 Mil/uL (ref 3.87–5.11)
RDW: 14.6 % (ref 11.5–15.5)
WBC: 8.8 10*3/uL (ref 4.0–10.5)

## 2015-11-28 LAB — LIPID PANEL
Cholesterol: 206 mg/dL — ABNORMAL HIGH (ref 0–200)
HDL: 66.3 mg/dL (ref >39.00)
LDL Cholesterol: 114 mg/dL — ABNORMAL HIGH (ref 0–99)
NonHDL: 139.9
Total CHOL/HDL Ratio: 3
Triglycerides: 131 mg/dL (ref 0.0–149.0)
VLDL: 26.2 mg/dL (ref 0.0–40.0)

## 2015-11-28 LAB — HEMOGLOBIN A1C: Hgb A1c MFr Bld: 8.5 % — ABNORMAL HIGH (ref 4.6–6.5)

## 2015-11-28 LAB — TSH: TSH: 2.54 u[IU]/mL (ref 0.35–4.50)

## 2015-12-01 ENCOUNTER — Telehealth: Payer: Self-pay | Admitting: *Deleted

## 2015-12-01 NOTE — Telephone Encounter (Signed)
Patient has requested a medication refill for Humalog Pharmacy [facility] [address] Patient also requested lab results  Pt contact [phone]

## 2015-12-01 NOTE — Telephone Encounter (Signed)
Patient needs a refill, please advise as not able to order the dosing in the computer, thanks.

## 2015-12-04 ENCOUNTER — Other Ambulatory Visit: Payer: Self-pay | Admitting: Family Medicine

## 2015-12-04 MED ORDER — INSULIN LISPRO PROT & LISPRO (75-25 MIX) 100 UNIT/ML KWIKPEN: PEN_INJECTOR | SUBCUTANEOUS | 11 refills | Status: DC

## 2015-12-04 NOTE — Telephone Encounter (Signed)
Rx sent 

## 2016-03-31 ENCOUNTER — Other Ambulatory Visit: Payer: Self-pay | Admitting: Family Medicine

## 2016-04-05 ENCOUNTER — Telehealth: Payer: Self-pay | Admitting: Family Medicine

## 2016-04-05 NOTE — Telephone Encounter (Signed)
Awaiting [facility] note. thanks 

## 2016-04-05 NOTE — Telephone Encounter (Signed)
Pt called and stated that she had surgery on her foot and there is swelling and warm to the touch. Sent call to [facility] Triage.

## 2016-04-08 ENCOUNTER — Ambulatory Visit: Payer: Medicare Other | Admitting: Family Medicine

## 2016-04-08 ENCOUNTER — Telehealth: Payer: Self-pay | Admitting: Family Medicine

## 2016-04-08 DIAGNOSIS — Z0289 Encounter for other administrative examinations: Secondary | ICD-10-CM

## 2016-04-08 NOTE — Telephone Encounter (Signed)
FYI

## 2016-04-08 NOTE — Telephone Encounter (Signed)
FYI - Pt missed appt this morning due to a flat tire. Someone is only coming now to fix it.

## 2016-04-09 ENCOUNTER — Ambulatory Visit (INDEPENDENT_AMBULATORY_CARE_PROVIDER_SITE_OTHER): Payer: Medicare Other | Admitting: Family

## 2016-04-09 ENCOUNTER — Ambulatory Visit (INDEPENDENT_AMBULATORY_CARE_PROVIDER_SITE_OTHER): Payer: Medicare Other

## 2016-04-09 ENCOUNTER — Ambulatory Visit
Admission: RE | Admit: 2016-04-09 | Discharge: 2016-04-09 | Disposition: A | Discharge status: Home / Self Care | Payer: Medicare Other | Source: Ambulatory Visit | Attending: Family | Admitting: Family

## 2016-04-09 ENCOUNTER — Encounter: Payer: Self-pay | Admitting: Family

## 2016-04-09 VITALS — BP 140/70 | HR 98 | Temp 98.4°F | Ht 63.75 in | Wt 247.0 lb

## 2016-04-09 DIAGNOSIS — M25471 Effusion, right ankle: Secondary | ICD-10-CM

## 2016-04-09 DIAGNOSIS — D649 Anemia, unspecified: Secondary | ICD-10-CM | POA: Diagnosis not present

## 2016-04-09 LAB — C-REACTIVE PROTEIN: CRP: 1 mg/dL (ref 0.5–20.0)

## 2016-04-09 NOTE — Progress Notes (Addendum)
Subjective:    Patient ID: Tina Blankenship, female    DOB: 1950-08-29, 65 y.o.   MRN: [id number]  CC: Tina Blankenship is a 65 y.o. female who presents today for an acute visit.    HPI: CC: right foot swelling with onset one week ago after walking through house and heard 'loud pop.' Keeping it elevated with little resolve. Swelling is not bad in the morning. Left ankle doesn't swell unless a lot of salt. Internal aspect of left tender to touch.   H/o arthritis, biltaeral ankle surgery right- 2007 and left-2004. Left ankle always larger more than left due to more hardware.  No fever, SOB, cough.   H/o HTN, CKD. No h/o CHF or gout.   Pt requests B12 injection refill. No recent b12. Ordered with labs per requsted.    HISTORY:  Past Medical History:  Diagnosis Date  . Asthma   . Chicken pox   . Chronic kidney disease   . Depression   . Diabetes mellitus   . Diabetic neuropathy ([place])   . GERD (gastroesophageal reflux disease)   . Heart murmur   . Hyperlipidemia   . Hypertension   . Hypothyroidism   . Migraines   . Urine incontinence    Past Surgical History:  Procedure Laterality Date  . FOOT SURGERY    . TUBAL LIGATION     Family History  Problem Relation Age of Onset  . Arthritis Mother   . Heart disease Mother   . Alcohol abuse Father   . Diabetes Father   . Sudden death Brother   . Arthritis Maternal Grandmother   . Heart disease Maternal Grandmother   . Hypertension Maternal Grandmother   . Pancreatic cancer Maternal Grandmother   . Arthritis Maternal Grandfather   . Prostate cancer Maternal Grandfather   . Hyperlipidemia Maternal Grandfather   . Diabetes Paternal Grandmother   . Stroke Paternal Grandfather     Allergies: Patient has no known allergies. Current Outpatient Prescriptions on File Prior to Visit  Medication Sig Dispense Refill  . albuterol (PROVENTIL HFA) 108 (90 Base) MCG/ACT inhaler Inhale into the lungs.    . B-D ULTRAFINE III SHORT PEN  31G X 8 MM MISC USE AS DIRECTED 100 each 3  . cyanocobalamin (,VITAMIN B-12,) 1000 MCG/ML injection 1 mL.    . DULoxetine (CYMBALTA) 60 MG capsule Take 1 capsule (60 mg total) by mouth daily. 90 capsule 3  . esomeprazole (NEXIUM) 40 MG capsule Take 1 capsule (40 mg total) by mouth daily. 90 capsule 3  . fluticasone (FLOVENT HFA) 44 MCG/ACT inhaler Inhale into the lungs.    . gabapentin (NEURONTIN) 800 MG tablet Take 1 tablet (800 mg total) by mouth 3 (three) times daily. 270 tablet 3  . Insulin Lispro Prot & Lispro (HUMALOG 75/25 MIX) (75-25) 100 UNIT/ML Kwikpen 36 units in the am and 40 units in the pm 15 mL 11  . Insulin Lispro Prot & Lispro (HUMALOG MIX 75/25 KWIKPEN [place]) Inject into the skin.    Marland Kitchen levothyroxine (SYNTHROID) 50 MCG tablet Take 1 tablet (50 mcg total) by mouth daily. 90 tablet 3  . lisinopril-hydrochlorothiazide (PRINZIDE,ZESTORETIC) 20-12.5 MG tablet Take 1 tablet by mouth daily. 90 tablet 3  . lovastatin (MEVACOR) 40 MG tablet Take 1 tablet (40 mg total) by mouth daily. 90 tablet 3  . montelukast (SINGULAIR) 10 MG tablet Take 1 tablet (10 mg total) by mouth daily. 90 tablet 0  . sitaGLIPtin-metformin (JANUMET) 50-1000 MG tablet Take 1  tablet by mouth 2 (two) times daily. 180 tablet 0  . Tdap (BOOSTRIX) 5-2.5-18.5 LF-MCG/0.5 injection Inject 0.5 mLs into the muscle once. 0.5 mL 0  . Zoster Vaccine Live, PF, (ZOSTAVAX) 37793 UNT/0.65ML injection Inject 19,400 Units into the skin once. 1 each 0   No current facility-administered medications on file prior to visit.     Social History  Substance Use Topics  . Smoking status: Former Research scientist (life sciences)  . Smokeless tobacco: Never Used  . Alcohol use No    Review of Systems  Constitutional: Negative for chills and fever.  Respiratory: Negative for cough and shortness of breath.   Cardiovascular: Positive for leg swelling. Negative for chest pain and palpitations.  Gastrointestinal: Negative for nausea and vomiting.  Musculoskeletal:  Positive for joint swelling. Negative for arthralgias.      Objective:    BP 140/70   Pulse 98   Temp 98.4 F (36.9 C) (Oral)   Ht 5' 3.75" (1.619 m)   Wt 247 lb (112 kg)   SpO2 96%   BMI 42.73 kg/m    Physical Exam  Constitutional: She appears well-developed and well-nourished.  Eyes: Conjunctivae are normal.  Cardiovascular: Normal rate, regular rhythm, normal heart sounds and normal pulses.   R calf > left calf. No palpable cords or masses. No erythema or increased warmth. No asymmetry in calf size when compared bilaterally LE hair growth symmetric and present. No discoloration of varicosities noted. LE warm and palpable pedal pulses.   Pulmonary/Chest: Effort normal and breath sounds normal. She has no wheezes. She has no rhonchi. She has no rales.  Musculoskeletal:       Right ankle: She exhibits decreased range of motion and swelling. No tenderness.       Left ankle: She exhibits decreased range of motion. She exhibits no swelling. No tenderness.       Left foot: There is swelling. There is normal range of motion, no tenderness and no bony tenderness.  +1 edema RLE over ankle. Trace non pitting edema dorsal aspect right foot Reduction in flexion and extension of bilateral ankles.   Neurological: She is alert.  Skin: Skin is warm and dry.  Psychiatric: She has a normal mood and affect. Her speech is normal and behavior is normal. Thought content normal.  Vitals reviewed.      Assessment & Plan:   1. Right ankle swelling Etiology unclear at this point. Considering Arthritis, Chronic Swelling from Degenerative Disease in Surgery and gout. Low Suspicion for DVT However Unilateral's Swelling, Appropriate RLE [place].   - US Venous Img Lower Unilateral Right - Sed Rate (ESR) - DG Ankle Complete Right - DG Foot Complete Right - CBC with Differential/Platelet - C-reactive protein    I am having Ms. Hottinger maintain her albuterol, cyanocobalamin, fluticasone, Insulin  Lispro Prot & Lispro (HUMALOG MIX 75/25 KWIKPEN [place]), Zoster Vaccine Live (PF), Tdap, sitaGLIPtin-metformin, montelukast, lovastatin, lisinopril-hydrochlorothiazide, levothyroxine, gabapentin, esomeprazole, DULoxetine, Insulin Lispro Prot & Lispro, and B-D ULTRAFINE III SHORT PEN.   No orders of the defined types were placed in this encounter.   Return precautions given.   Risks, benefits, and alternatives of the medications and treatment plan prescribed today were discussed, and patient expressed understanding.   Education regarding symptom management and diagnosis given to patient on AVS.  Continue to follow with Coral Spikes, DO for routine health maintenance.   Tina Blankenship and I agreed with plan.   Mable Paris, FNP

## 2016-04-09 NOTE — Progress Notes (Signed)
Pre visit review using our clinic review tool, if applicable. No additional management support is needed unless otherwise documented below in the visit note. 

## 2016-04-09 NOTE — Patient Instructions (Signed)
XR here Labs here [facility]- see melissa.  Will call with results  Continue to elevate leg, low salt diet.

## 2016-04-09 NOTE — Addendum Note (Signed)
Addended by: Allegra GranaARNETT, Praise Dolecki G on: 04/09/2016 01:55 PM   Modules accepted: Orders

## 2016-04-10 LAB — CBC WITH DIFFERENTIAL/PLATELET
Basophils Absolute: 0.1 10*3/uL (ref 0.0–0.1)
Basophils Relative: 0.7 % (ref 0.0–3.0)
Eosinophils Absolute: 0.2 10*3/uL (ref 0.0–0.7)
Eosinophils Relative: 2.4 % (ref 0.0–5.0)
HCT: 34 % — ABNORMAL LOW (ref 36.0–46.0)
Hemoglobin: 11.2 g/dL — ABNORMAL LOW (ref 12.0–15.0)
Lymphocytes Relative: 35.7 % (ref 12.0–46.0)
Lymphs Abs: 2.7 10*3/uL (ref 0.7–4.0)
MCHC: 32.9 g/dL (ref 30.0–36.0)
MCV: 87 fl (ref 78.0–100.0)
Monocytes Absolute: 0.5 10*3/uL (ref 0.1–1.0)
Monocytes Relative: 6.8 % (ref 3.0–12.0)
Neutro Abs: 4.1 10*3/uL (ref 1.4–7.7)
Neutrophils Relative %: 54.4 % (ref 43.0–77.0)
Platelets: 299 10*3/uL (ref 150.0–400.0)
RBC: 3.91 Mil/uL (ref 3.87–5.11)
RDW: 15.1 % (ref 11.5–15.5)
WBC: 7.5 10*3/uL (ref 4.0–10.5)

## 2016-04-10 LAB — SEDIMENTATION RATE: Sed Rate: 19 mm/hr (ref 0–30)

## 2016-04-10 NOTE — Progress Notes (Signed)
Called patient but was unable to leave VM due to VM not being set up.

## 2016-04-11 ENCOUNTER — Telehealth: Payer: Self-pay

## 2016-04-11 NOTE — Telephone Encounter (Signed)
Spoke to [name]. She said to advise pt of situation and leave it up to her if she would like to re-draw for B12. Pt was requesting B12 lab at last OV to see if she needs to have B12 injections. Pt may not need injections so the lab will be a good way to determine what kind of B12 supplement pt will need.  I attempted to call patient, but no answer and no VM set up.

## 2016-04-11 NOTE — Telephone Encounter (Signed)
Letter has been mailed.

## 2016-04-11 NOTE — Telephone Encounter (Signed)
Tina Blankenship lab called to inform Tina Blankenship they missed the B12 and Folate order from 04/09/16. They lost power on 04/09/16 and some labs were not able to be run at the time and it was missed the next day. They were able to perform the other tests. Would you like Tina Blankenship to call the patient for a re-draw?

## 2016-04-11 NOTE — Telephone Encounter (Signed)
Please mail pt-  Notify pt that recent b12 labs were unfortunately not collected due to power loss at clinic.   Advise her to make f/u with PCP to check b12 to decide if she needs to supplement with b12.

## 2016-04-17 ENCOUNTER — Other Ambulatory Visit: Payer: Self-pay | Admitting: Family Medicine

## 2016-07-29 ENCOUNTER — Telehealth: Payer: Self-pay | Admitting: *Deleted

## 2016-07-29 MED ORDER — DULOXETINE HCL 60 MG PO CPEP: 60.0000 mg | ORAL_CAPSULE | Freq: Every day | ORAL | 2 refills | Status: DC

## 2016-07-29 NOTE — Telephone Encounter (Signed)
Pt called and medication refilled.

## 2016-07-29 NOTE — Telephone Encounter (Signed)
Requested medication refill for :Duloxetine  [facility] [address]  Please Contact Pt when ready or sent to Pharmacy:  [phone]

## 2016-08-09 ENCOUNTER — Ambulatory Visit: Payer: Medicare Other | Admitting: Family Medicine

## 2016-09-12 ENCOUNTER — Telehealth: Payer: Self-pay | Admitting: *Deleted

## 2016-09-12 ENCOUNTER — Ambulatory Visit: Payer: Medicare Other | Admitting: Family Medicine

## 2016-09-12 DIAGNOSIS — Z0289 Encounter for other administrative examinations: Secondary | ICD-10-CM

## 2016-09-12 NOTE — Telephone Encounter (Signed)
Voicemail : Patient has requested a call to discuss her the reason for changes in her medications gabapentin and duloxetine. Patient has concerns about the change in reference to the amount she should take daily, pt feels this the dosage is to low.  Pt contact [phone]

## 2016-09-13 ENCOUNTER — Other Ambulatory Visit: Payer: Self-pay | Admitting: Family Medicine

## 2016-09-13 MED ORDER — DULOXETINE HCL 60 MG PO CPEP: 60.0000 mg | ORAL_CAPSULE | Freq: Two times a day (BID) | ORAL | 1 refills | Status: DC

## 2016-09-13 MED ORDER — GABAPENTIN 800 MG PO TABS: 800.0000 mg | ORAL_TABLET | Freq: Three times a day (TID) | ORAL | 3 refills | Status: DC

## 2016-09-13 NOTE — Telephone Encounter (Signed)
Dose can be increased then to 1200 mg 3 times daily. I am just prescribing how the manufacturer designates.

## 2016-09-13 NOTE — Telephone Encounter (Signed)
Called patient but was unable to leave voicemail.

## 2016-09-13 NOTE — Telephone Encounter (Signed)
Pt called stating that she is taking 80 mg of gabapentin 4 times daily which was helping with her neuropathy. Pt states new rx says 3 times daily. cymbalta she was taking twice daily but new rx sent for once daily. Pt would like both rx to be sent for the original dosages she was taking.

## 2016-09-13 NOTE — Telephone Encounter (Signed)
Pt states that the 3 times daily does not help.

## 2016-09-13 NOTE — Telephone Encounter (Signed)
Gabapentin is 3 times a day. It is not meant to be taken 4 times daily. Cymbalta will be changed.

## 2016-09-16 NOTE — Telephone Encounter (Signed)
Please call patient, thanks

## 2016-09-16 NOTE — Telephone Encounter (Signed)
Unable to contact patient due to mailbox not set up.. Patient will need to call back later to get dosage.

## 2016-09-17 ENCOUNTER — Other Ambulatory Visit: Payer: Self-pay | Admitting: Family Medicine

## 2016-09-19 ENCOUNTER — Other Ambulatory Visit: Payer: Self-pay | Admitting: Family Medicine

## 2016-09-30 ENCOUNTER — Encounter: Payer: Self-pay | Admitting: Family Medicine

## 2016-09-30 ENCOUNTER — Other Ambulatory Visit: Payer: Self-pay | Admitting: Family Medicine

## 2016-09-30 ENCOUNTER — Ambulatory Visit (INDEPENDENT_AMBULATORY_CARE_PROVIDER_SITE_OTHER): Payer: Medicare Other | Admitting: Family Medicine

## 2016-09-30 VITALS — BP 132/72 | HR 85 | Temp 98.6°F | Resp 12 | Wt 252.0 lb

## 2016-09-30 DIAGNOSIS — E785 Hyperlipidemia, unspecified: Secondary | ICD-10-CM | POA: Diagnosis not present

## 2016-09-30 DIAGNOSIS — Z0000 Encounter for general adult medical examination without abnormal findings: Secondary | ICD-10-CM | POA: Diagnosis not present

## 2016-09-30 DIAGNOSIS — Z1239 Encounter for other screening for malignant neoplasm of breast: Secondary | ICD-10-CM

## 2016-09-30 DIAGNOSIS — E538 Deficiency of other specified B group vitamins: Secondary | ICD-10-CM | POA: Diagnosis not present

## 2016-09-30 DIAGNOSIS — E1142 Type 2 diabetes mellitus with diabetic polyneuropathy: Secondary | ICD-10-CM | POA: Diagnosis not present

## 2016-09-30 DIAGNOSIS — Z1231 Encounter for screening mammogram for malignant neoplasm of breast: Secondary | ICD-10-CM

## 2016-09-30 DIAGNOSIS — E039 Hypothyroidism, unspecified: Secondary | ICD-10-CM | POA: Diagnosis not present

## 2016-09-30 DIAGNOSIS — I10 Essential (primary) hypertension: Secondary | ICD-10-CM

## 2016-09-30 DIAGNOSIS — R0989 Other specified symptoms and signs involving the circulatory and respiratory systems: Secondary | ICD-10-CM | POA: Diagnosis not present

## 2016-09-30 DIAGNOSIS — N183 Chronic kidney disease, stage 3 unspecified: Secondary | ICD-10-CM

## 2016-09-30 DIAGNOSIS — R197 Diarrhea, unspecified: Secondary | ICD-10-CM | POA: Diagnosis not present

## 2016-09-30 LAB — COMPREHENSIVE METABOLIC PANEL
ALT: 13 U/L (ref 0–35)
AST: 14 U/L (ref 0–37)
Albumin: 4.3 g/dL (ref 3.5–5.2)
Alkaline Phosphatase: 76 U/L (ref 39–117)
BUN: 25 mg/dL — ABNORMAL HIGH (ref 6–23)
CO2: 27 mEq/L (ref 19–32)
Calcium: 10.2 mg/dL (ref 8.4–10.5)
Chloride: 101 mEq/L (ref 96–112)
Creatinine, Ser: 1.23 mg/dL — ABNORMAL HIGH (ref 0.40–1.20)
GFR: 46.65 mL/min — ABNORMAL LOW (ref >60.00)
Glucose, Bld: 50 mg/dL — ABNORMAL LOW (ref 70–99)
Potassium: 4.4 mEq/L (ref 3.5–5.1)
Sodium: 137 mEq/L (ref 135–145)
Total Bilirubin: 0.3 mg/dL (ref 0.2–1.2)
Total Protein: 7.6 g/dL (ref 6.0–8.3)

## 2016-09-30 LAB — LIPID PANEL
Cholesterol: 191 mg/dL (ref 0–200)
HDL: 67.7 mg/dL (ref >39.00)
LDL Cholesterol: 100 mg/dL — ABNORMAL HIGH (ref 0–99)
NonHDL: 123.39
Total CHOL/HDL Ratio: 3
Triglycerides: 117 mg/dL (ref 0.0–149.0)
VLDL: 23.4 mg/dL (ref 0.0–40.0)

## 2016-09-30 LAB — TSH: TSH: 2.11 u[IU]/mL (ref 0.35–4.50)

## 2016-09-30 LAB — CBC
HCT: 35.7 % — ABNORMAL LOW (ref 36.0–46.0)
Hemoglobin: 11.7 g/dL — ABNORMAL LOW (ref 12.0–15.0)
MCHC: 32.6 g/dL (ref 30.0–36.0)
MCV: 85.4 fl (ref 78.0–100.0)
Platelets: 325 10*3/uL (ref 150.0–400.0)
RBC: 4.18 Mil/uL (ref 3.87–5.11)
RDW: 16.5 % — ABNORMAL HIGH (ref 11.5–15.5)
WBC: 10.2 10*3/uL (ref 4.0–10.5)

## 2016-09-30 LAB — HEMOGLOBIN A1C: Hgb A1c MFr Bld: 8.1 % — ABNORMAL HIGH (ref 4.6–6.5)

## 2016-09-30 LAB — VITAMIN B12: Vitamin B-12: 246 pg/mL (ref 211–911)

## 2016-09-30 MED ORDER — TETANUS-DIPHTH-ACELL PERTUSSIS 5-2.5-18.5 LF-MCG/0.5 IM SUSP: 0.5000 mL | Freq: Once | INTRAMUSCULAR | 0 refills | Status: AC

## 2016-09-30 MED ORDER — GABAPENTIN 800 MG PO TABS: ORAL_TABLET | ORAL | 3 refills | Status: DC

## 2016-09-30 NOTE — Patient Instructions (Signed)
We will call with your lab results.  Decrease the Janumet to once daily (to see if it helps the diarrhea).   Continue your other medications.  You need vascular studies on your right leg.  We will arrange.  We will arrange follow up regarding your diabetes with our pharmacist.  Take care  Dr. Adriana Simasook

## 2016-10-01 DIAGNOSIS — R197 Diarrhea, unspecified: Secondary | ICD-10-CM | POA: Insufficient documentation

## 2016-10-01 DIAGNOSIS — Z0000 Encounter for general adult medical examination without abnormal findings: Secondary | ICD-10-CM | POA: Insufficient documentation

## 2016-10-01 DIAGNOSIS — R0989 Other specified symptoms and signs involving the circulatory and respiratory systems: Secondary | ICD-10-CM | POA: Insufficient documentation

## 2016-10-01 NOTE — Assessment & Plan Note (Signed)
Remains uncontrolled. A1c 8.1 today. Patient to see pharmacy. I would prefer her off of 75/25.

## 2016-10-01 NOTE — Progress Notes (Signed)
Subjective:  Patient ID: Tina Blankenship, female    DOB: 10-13-1950  Age: 66 y.o. MRN: [id number]  CC: Follow up; Wants Tdap; Diarrhea  HPI:  66 year old female with hypertension, hypothyroidism, DM 2, CKD, hyperlipidemia resents for follow-up. She has additional complaints. See below.  DM 2  Uncontrolled.  Sugars are very variable. She has intermittent episodes of hypoglycemia particularly in the morning.  She is currently on Janumet and Humalog 75/25 (36 in the am and 40 in the PM).   Needs labs and A1c today.  HTN  Stable on lisinopril/HCTZ.  Diarrhea  Patient has had ongoing diarrhea the past 6-7 months.  No dietary changes.  She's used Imodium with improvement. No reports of abdominal pain. No hematochezia or melena.  No known exacerbating factors. No other associated symptoms.  Preventative healthcare  Patient is in need of a Tdap and would like one (she has a young grandchild).  In need of mammogram.  Has upcoming eye appointment.  Social Hx   Social History   Social History  . Marital status: Divorced    Spouse name: N/A  . Number of children: N/A  . Years of education: N/A   Social History Main Topics  . Smoking status: Former Games developermoker  . Smokeless tobacco: Never Used  . Alcohol use No  . Drug use: No  . Sexual activity: Not Asked   Other Topics Concern  . None   Social History Narrative  . None    Review of Systems  Constitutional: Negative.   Gastrointestinal: Positive for diarrhea.   Objective:  BP 132/72 (BP Location: Left Arm, Patient Position: Sitting, Cuff Size: Large)   Pulse 85   Temp 98.6 F (37 C) (Oral)   Resp 12   Wt 252 lb (114.3 kg)   SpO2 98%   BMI 43.60 kg/m   BP/Weight 09/30/2016 04/09/2016 10/25/2015  Systolic BP 132 140 132  Diastolic BP 72 70 76  Wt. (Lbs) 252 247 230  BMI 43.6 42.73 39.8    Physical Exam  Constitutional: She is oriented to person, place, and time.  Morbidly obese female in no acute  distress.  Cardiovascular: Normal rate and regular rhythm.   Could not appreciate pulses in the right foot.  Pulmonary/Chest: Effort normal. She has no wheezes. She has no rales.  Abdominal: Soft. She exhibits no distension. There is no tenderness. There is no rebound and no guarding.  Neurological: She is alert and oriented to person, place, and time.  Psychiatric: She has a normal mood and affect.  Vitals reviewed.   Lab Results  Component Value Date   WBC 10.2 09/30/2016   HGB 11.7 (L) 09/30/2016   HCT 35.7 (L) 09/30/2016   PLT 325.0 09/30/2016   GLUCOSE 50 (L) 09/30/2016   CHOL 191 09/30/2016   TRIG 117.0 09/30/2016   HDL 67.70 09/30/2016   LDLCALC 100 (H) 09/30/2016   ALT 13 09/30/2016   AST 14 09/30/2016   NA 137 09/30/2016   K 4.4 09/30/2016   CL 101 09/30/2016   CREATININE 1.23 (H) 09/30/2016   BUN 25 (H) 09/30/2016   CO2 27 09/30/2016   TSH 2.11 09/30/2016   INR 1.2 01/07/2009   HGBA1C 8.1 (H) 09/30/2016    Assessment & Plan:   Problem List Items Addressed This Visit      Cardiovascular and Mediastinum   Essential hypertension    Stable on lisinopril/HCTZ. Continue.       Relevant Orders   Comprehensive metabolic panel (  Completed)     Endocrine   Hypothyroidism   Relevant Orders   TSH (Completed)   DM type 2 with diabetic peripheral neuropathy (HCC) - Primary    Remains uncontrolled. A1c 8.1 today. Patient to see pharmacy. I would prefer her off of 75/25.      Relevant Medications   gabapentin (NEURONTIN) 800 MG tablet   Other Relevant Orders   Hemoglobin A1c (Completed)     Genitourinary   CKD (chronic kidney disease) stage 3, GFR 30-59 ml/min   Relevant Orders   CBC (Completed)     Other   Preventative health care    Tdap Rx given today. Mammogram ordered.      Hyperlipidemia   Relevant Orders   Lipid panel (Completed)   Diminished pulses in lower extremity    New problem.  Could not appreciate pulses in the right lower  extremity. We'll arrange to see vascular.      Relevant Orders   Ambulatory referral to Vascular Surgery   Diarrhea    New problem. Likely from Janumet. Decreasing to 1 tablet daily.       Other Visit Diagnoses    Vitamin B12 deficiency       Relevant Orders   Vitamin B12 (Completed)   Breast cancer screening       Relevant Orders   MM Digital Screening      Meds ordered this encounter  Medications  . gabapentin (NEURONTIN) 800 MG tablet    Sig: 800 mg in the am, 800 mg  in the afternoon, and 1600 mg in the evening.    Dispense:  360 tablet    Refill:  3  . Tdap (BOOSTRIX) 5-2.5-18.5 LF-MCG/0.5 injection    Sig: Inject 0.5 mLs into the muscle once.    Dispense:  0.5 mL    Refill:  0   Follow-up: 3-6 months  Noa Galvao Adriana Simas DO [facility]

## 2016-10-01 NOTE — Assessment & Plan Note (Signed)
Stable on lisinopril/HCTZ. Continue. 

## 2016-10-01 NOTE — Assessment & Plan Note (Signed)
New problem. Likely from Janumet. Decreasing to 1 tablet daily.

## 2016-10-01 NOTE — Assessment & Plan Note (Signed)
Tdap Rx given today. Mammogram ordered.

## 2016-10-01 NOTE — Assessment & Plan Note (Signed)
New problem.  Could not appreciate pulses in the right lower extremity. We'll arrange to see vascular.

## 2016-10-02 ENCOUNTER — Other Ambulatory Visit: Payer: Self-pay | Admitting: Family Medicine

## 2016-10-04 ENCOUNTER — Telehealth: Payer: Self-pay | Admitting: Radiology

## 2016-10-04 ENCOUNTER — Other Ambulatory Visit: Payer: Self-pay | Admitting: Family Medicine

## 2016-10-04 DIAGNOSIS — D649 Anemia, unspecified: Secondary | ICD-10-CM

## 2016-10-04 NOTE — Telephone Encounter (Signed)
Pt coming in for labs Monday, please place future orders. Thank you 

## 2016-10-07 ENCOUNTER — Other Ambulatory Visit (INDEPENDENT_AMBULATORY_CARE_PROVIDER_SITE_OTHER): Payer: Medicare Other

## 2016-10-07 DIAGNOSIS — D649 Anemia, unspecified: Secondary | ICD-10-CM | POA: Diagnosis not present

## 2016-10-08 ENCOUNTER — Other Ambulatory Visit: Payer: Self-pay | Admitting: Family Medicine

## 2016-10-08 ENCOUNTER — Encounter: Payer: Self-pay | Admitting: *Deleted

## 2016-10-08 LAB — IRON,TIBC AND FERRITIN PANEL
%SAT: 7 % — ABNORMAL LOW (ref 11–50)
Ferritin: 5 ng/mL — ABNORMAL LOW (ref 20–288)
Iron: 29 ug/dL — ABNORMAL LOW (ref 45–160)
TIBC: 443 ug/dL (ref 250–450)

## 2016-10-08 MED ORDER — FERROUS SULFATE 325 (65 FE) MG PO TABS: 325.0000 mg | ORAL_TABLET | Freq: Two times a day (BID) | ORAL | 3 refills | Status: DC

## 2016-10-11 ENCOUNTER — Other Ambulatory Visit: Payer: Self-pay | Admitting: Family Medicine

## 2016-10-21 ENCOUNTER — Ambulatory Visit: Payer: Medicare Other | Admitting: Pharmacist

## 2016-10-22 ENCOUNTER — Telehealth: Payer: Self-pay

## 2016-10-22 NOTE — Telephone Encounter (Signed)
Does patient continue B 12 injection along with iron supplementations.?

## 2016-10-22 NOTE — Telephone Encounter (Signed)
Yes

## 2016-10-23 NOTE — Telephone Encounter (Signed)
Left voice mail to call back 

## 2016-10-24 ENCOUNTER — Other Ambulatory Visit: Payer: Self-pay | Admitting: Family Medicine

## 2016-10-24 NOTE — Telephone Encounter (Signed)
Patient advised of below and verbalized understanding.  

## 2016-10-25 NOTE — Telephone Encounter (Signed)
B 12 lab was on low side of normal ok to fill B 12?

## 2016-11-04 ENCOUNTER — Ambulatory Visit: Payer: Medicare Other | Admitting: Pharmacist

## 2016-11-27 ENCOUNTER — Telehealth: Payer: Self-pay | Admitting: Family Medicine

## 2016-11-27 NOTE — Telephone Encounter (Signed)
Pt called requesting a refill on her HUMALOG MIX 75/25 KWIKPEN (75-25) 100 UNIT/ML Kwikpen. Pt states that she just started her last pen and it doesn't seem like they last as long. Please advise, thank you!  [phone] [place] [address]

## 2016-11-28 NOTE — Telephone Encounter (Addendum)
Patient states not always using the Humalog in the am, or will cut down the units to 25-30   if blood sugars is low in the am.  In the pm will use 40 units  If blood sugar are running  low .

## 2016-11-29 ENCOUNTER — Other Ambulatory Visit: Payer: Self-pay | Admitting: Family Medicine

## 2016-11-29 MED ORDER — INSULIN LISPRO PROT & LISPRO (75-25 MIX) 100 UNIT/ML KWIKPEN: PEN_INJECTOR | SUBCUTANEOUS | 11 refills | Status: DC

## 2016-12-22 ENCOUNTER — Other Ambulatory Visit: Payer: Self-pay | Admitting: Family Medicine

## 2017-01-02 ENCOUNTER — Ambulatory Visit: Payer: Medicare Other | Admitting: Family Medicine

## 2017-01-02 ENCOUNTER — Other Ambulatory Visit: Payer: Self-pay | Admitting: Family Medicine

## 2017-01-06 ENCOUNTER — Ambulatory Visit (INDEPENDENT_AMBULATORY_CARE_PROVIDER_SITE_OTHER): Payer: Medicare Other | Admitting: Family Medicine

## 2017-01-06 ENCOUNTER — Encounter: Payer: Self-pay | Admitting: Family Medicine

## 2017-01-06 VITALS — BP 120/60 | HR 92 | Temp 98.3°F | Ht 63.0 in | Wt 258.6 lb

## 2017-01-06 DIAGNOSIS — E785 Hyperlipidemia, unspecified: Secondary | ICD-10-CM

## 2017-01-06 DIAGNOSIS — R0989 Other specified symptoms and signs involving the circulatory and respiratory systems: Secondary | ICD-10-CM

## 2017-01-06 DIAGNOSIS — E1142 Type 2 diabetes mellitus with diabetic polyneuropathy: Secondary | ICD-10-CM | POA: Diagnosis not present

## 2017-01-06 DIAGNOSIS — L84 Corns and callosities: Secondary | ICD-10-CM

## 2017-01-06 DIAGNOSIS — R002 Palpitations: Secondary | ICD-10-CM | POA: Diagnosis not present

## 2017-01-06 DIAGNOSIS — I10 Essential (primary) hypertension: Secondary | ICD-10-CM

## 2017-01-06 LAB — POCT GLYCOSYLATED HEMOGLOBIN (HGB A1C): Hemoglobin A1C: 9.6

## 2017-01-06 MED ORDER — INSULIN DEGLUDEC 100 UNIT/ML ~~LOC~~ SOPN: 50.0000 [IU] | PEN_INJECTOR | Freq: Every day | SUBCUTANEOUS | 3 refills | Status: DC

## 2017-01-06 MED ORDER — INSULIN LISPRO 100 UNIT/ML (KWIKPEN): 8.0000 [IU] | PEN_INJECTOR | Freq: Three times a day (TID) | SUBCUTANEOUS | 3 refills | Status: DC

## 2017-01-06 MED ORDER — INSULIN PEN NEEDLE 31G X 8 MM MISC: 3 refills | Status: DC

## 2017-01-06 NOTE — Patient Instructions (Signed)
Nice to meet you. We will get her referred to a podiatrist and cardiologist. We will get she set up for ABIs. We are going to change her insulin. I sent in tresiba 50 units daily for you to start on. He will also take Humalog 8 units 3 times daily with meals. You need to make sure that you eat 3 meals daily. If you start to have low blood sugars with this change please contact us.

## 2017-01-08 DIAGNOSIS — R002 Palpitations: Secondary | ICD-10-CM | POA: Insufficient documentation

## 2017-01-08 NOTE — Assessment & Plan Note (Signed)
Patient with low fasting sugars in the morning though A1c is quite elevated at 9.6. Discussed with our pharmacist and we opted to convert the patient to tresiba and Humalog. Our pharmacist did the conversion and the regimen we came up with was tresiba 50 units daily and Humalog 8 units 3 times a day with meals. Discussed that this should be covered by the patient's insurance. Discussed that she should monitor for hypoglycemia with this and if this occurs more frequently she should let us know. Foot exam was completed and is outlined above. We will obtain ABIs and refer her to a podiatrist.

## 2017-01-08 NOTE — Assessment & Plan Note (Signed)
Well controlled. Continue current medication.  

## 2017-01-08 NOTE — Assessment & Plan Note (Signed)
Obtain ABIs. 

## 2017-01-08 NOTE — Assessment & Plan Note (Signed)
Continue lovastatin 

## 2017-01-08 NOTE — Assessment & Plan Note (Signed)
Given patient's description I suspect PVCs. They have been occurring somewhat more frequently. We'll refer to cardiology for further evaluation.

## 2017-01-08 NOTE — Progress Notes (Signed)
[name], MD Phone: [phone]  Tina Blankenship is a 66 y.o. female who presents today for f/u.  Hypertension: Not checking at home. Taking lisinopril and HCTZ. No chest pain or shortness of breath.  Diabetes: Checking her sugars and they typically run 70-100 in the morning fasting. She's taking Janumet 1 pill daily. Also taking 70/30 insulin 36 units in the morning and 40 units at night. No polyuria or polydipsia. Does occasionally have hypoglycemic symptoms when her sugars are less than 80. Describes it as being "bitchy." She sees ophthalmology later this month.  Patient notes for many years since she was young she will occasionally feel a thump in her chest. It will be brief. Slightly more frequent recently. Occurs every few days. No syncope. Does have family history of heart disease. Reports a stress test in 2007 that was normal.  Patient is on lovastatin for her cholesterol.  PMH: Former smoker   ROS see history of present illness  Objective  Physical Exam Vitals:   01/06/17 1552  BP: 120/60  Pulse: 92  Temp: 98.3 F (36.8 C)  SpO2: 95%    BP Readings from Last 3 Encounters:  01/06/17 120/60  09/30/16 132/72  04/09/16 140/70   Wt Readings from Last 3 Encounters:  01/06/17 258 lb 9.6 oz (117.3 kg)  09/30/16 252 lb (114.3 kg)  04/09/16 247 lb (112 kg)    Physical Exam  Constitutional: No distress.  HENT:  Head: Normocephalic and atraumatic.  Cardiovascular: Normal rate, regular rhythm and normal heart sounds.   Pulmonary/Chest: Effort normal and breath sounds normal.  Musculoskeletal: She exhibits no edema.  Neurological: She is alert. Gait normal.  Skin: Skin is warm and dry. She is not diaphoretic.   Diabetic Foot Exam - Simple   Simple Foot Form Diabetic Foot exam was performed with the following findings:  Yes 01/06/2017  4:11 PM  Visual Inspection See comments:  Yes Sensation Testing See comments:  Yes Pulse Check See comments:   Yes Comments Patient with callus in the right mid foot with surrounding flaking though no drainage or surrounding erythema or tenderness,  left foot near the ball of her foot with a thick callus as well, patient with decreased sensation to light touch and monofilament testing, patient with palpable DP and PT pulses bilaterally though they are diminished     EKG: Normal sinus rhythm, rate 88, no ischemic changes  Assessment/Plan: Please see individual problem list.  Essential hypertension Well-controlled. Continue current medication.  DM type 2 with diabetic peripheral neuropathy (HCC) Patient with low fasting sugars in the morning though A1c is quite elevated at 9.6. Discussed with our pharmacist and we opted to convert the patient to tresiba and Humalog. Our pharmacist did the conversion and the regimen we came up with was tresiba 50 units daily and Humalog 8 units 3 times a day with meals. Discussed that this should be covered by the patient's insurance. Discussed that she should monitor for hypoglycemia with this and if this occurs more frequently she should let us know. Foot exam was completed and is outlined above. We will obtain ABIs and refer her to a podiatrist.  Hyperlipidemia Continue lovastatin.  Diminished pulses in lower extremity Obtain ABIs.  Palpitations Given patient's description I suspect PVCs. They have been occurring somewhat more frequently. We'll refer to cardiology for further evaluation.   Orders Placed This Encounter  Procedures  . Ambulatory referral to Podiatry    Referral Priority:   Routine    Referral  Type:   Consultation    Referral Reason:   Specialty Services Required    Requested Specialty:   Podiatry    Number of Visits Requested:   1  . Ambulatory referral to Cardiology    Referral Priority:   Routine    Referral Type:   Consultation    Referral Reason:   Specialty Services Required    Requested Specialty:   Cardiology    Number of Visits  Requested:   1  . POCT HgB A1C  . EKG 12-Lead    Meds ordered this encounter  Medications  . insulin degludec (TRESIBA FLEXTOUCH) 100 UNIT/ML SOPN FlexTouch Pen    Sig: Inject 0.5 mLs (50 Units total) into the skin daily at 10 pm.    Dispense:  15 mL    Refill:  3  . insulin lispro (HUMALOG KWIKPEN) 100 UNIT/ML KiwkPen    Sig: Inject 0.08 mLs (8 Units total) into the skin 3 (three) times daily with meals.    Dispense:  5 pen    Refill:  3  . Insulin Pen Needle (B-D ULTRAFINE III SHORT PEN) 31G X 8 MM MISC    Sig: Use 4 times daily with insulin    Dispense:  120 each    Refill:  3    Marikay AlarEric Itzelle Gains, MD [facility] [place] [facility]

## 2017-01-17 ENCOUNTER — Ambulatory Visit (INDEPENDENT_AMBULATORY_CARE_PROVIDER_SITE_OTHER): Payer: Medicare Other

## 2017-01-17 DIAGNOSIS — R0989 Other specified symptoms and signs involving the circulatory and respiratory systems: Secondary | ICD-10-CM

## 2017-01-20 ENCOUNTER — Encounter: Payer: Self-pay | Admitting: Pharmacist

## 2017-01-20 ENCOUNTER — Ambulatory Visit (INDEPENDENT_AMBULATORY_CARE_PROVIDER_SITE_OTHER): Payer: Medicare Other | Admitting: Pharmacist

## 2017-01-20 VITALS — BP 121/72 | HR 82 | Wt 262.4 lb

## 2017-01-20 DIAGNOSIS — E1142 Type 2 diabetes mellitus with diabetic polyneuropathy: Secondary | ICD-10-CM | POA: Diagnosis not present

## 2017-01-20 DIAGNOSIS — K219 Gastro-esophageal reflux disease without esophagitis: Secondary | ICD-10-CM

## 2017-01-20 MED ORDER — RANITIDINE HCL 75 MG PO TABS: 75.0000 mg | ORAL_TABLET | Freq: Two times a day (BID) | ORAL | 3 refills | Status: DC | PRN

## 2017-01-20 MED ORDER — ESOMEPRAZOLE MAGNESIUM 20 MG PO CPDR: 20.0000 mg | DELAYED_RELEASE_CAPSULE | Freq: Every day | ORAL | 2 refills | Status: DC

## 2017-01-20 MED ORDER — INSULIN DEGLUDEC 100 UNIT/ML ~~LOC~~ SOPN: 25.0000 [IU] | PEN_INJECTOR | Freq: Every day | SUBCUTANEOUS | 3 refills | Status: DC

## 2017-01-20 MED ORDER — INSULIN LISPRO 100 UNIT/ML (KWIKPEN): 8.0000 [IU] | PEN_INJECTOR | Freq: Three times a day (TID) | SUBCUTANEOUS | 3 refills | Status: DC

## 2017-01-20 NOTE — Progress Notes (Signed)
I have reviewed the above note and agree.  Jakell Trusty, M.D.  

## 2017-01-20 NOTE — Progress Notes (Signed)
S:    Chief Complaint  Patient presents with  . Medication Management    Diabetes   Patient arrives in fair spirits, ambulating without assistance.  Presents for diabetes evaluation, education, and management at the request of Dr. Birdie Sons. Patient was referred on 01/06/17.  Patient was last seen by Primary Care Provider on 01/06/17. At that time, Novolin 70/30 was d/c'd and she was converted to tresiba 50 units daily and humalog 8 units TID with meals.  Patient states that she does not like the new insulin change. States that her vision has worsened, she is having headaches and her CBGs are uncontrolled. Reports she is giving herself tresiba 15 units daily and humalog 8 units TID without regard to meals. Endorses one episode of hypoglycemia last week but improved from previous. Patient states that when her son was ill several years ago she got out of the habit of giving herself her AM dose of 70/30 because it consistently resulted in hypoglycemia as she does not eat breakfast.   States that she wants to get off nexium because she read it makes her bones soft. Meds are affordable.   Patient reports Diabetes was diagnosed in 1999.   Family/Social History: works nights, son has DM and has had DKA and near-amputation. Cares for 18 month old grandchild  Patient reports adherence with medications however had misunderstood insulin dose instructions and was not giving enough [place] and was taking humalog qAM even though she was not eating breakfast.  Current diabetes medications include: Tresiba 15 units daily, humalog 8 units TID, Janumet 50-1000 mg daily Current hypertension medications include: lisinopril - HCTZ  Patient reports hypoglycemic events. x1 last week to 55 mg/dL later in the morning. Corrected with milk.   Patient reported dietary habits: Eats 2 meals/day Breakfast:coffee Lunch:sandwich  Or salad Dinner: ham and cheese roll up  Snacks: not many Drinks: water,  coffee  Patient reported exercise habits: 58 month old baby, no formal exercise.    Patient denies nocturia.  Patient denies neuropathy when she takes gabapentin regularly.  Patient reports visual changes since change in insulin Patient reports self foot exams. Callous on L foot - not worrisome.   O:  Physical Exam  Constitutional: She appears well-developed and well-nourished.  Vitals reviewed.   Review of Systems  All other systems reviewed and are negative.   Lab Results  Component Value Date   HGBA1C 9.6 01/06/2017   Vitals:   01/20/17 0815  BP: 121/72  Pulse: 82    Home fasting CBG: 217 this AM in clinic, patient states they have been "high" but cannot remember specific numbers  10 year ASCVD risk: 10.0%.  A/P: Diabetes longstanding currently uncontrolled per A1C, in-office CBG check, and self reported CBGs. Patient reports hypoglycemic events and is able to verbalize appropriate hypoglycemia management plan after counseling. Patient reports adherence with medication. Control is suboptimal due to misunderstanding of insulin dosing instructions, insulin resistance, sedentary lifestyle.  Following discussion and approval by Dr. Birdie Sons, the following medication changes were made:  -Increased dose of basal insulin Tresiba (insulin degludec) to 25 units daily, titrate by 1 unit until FBG between 90-130. Adjusted dose of rapid insulin Humalog (insulin lispro) to 8 units WITH MEALS only. -Continue Janumet 50-1000 mg BID -Increase CBG check to 2-3 times daily (Fasting and 2hr post prandial), provided with CBG log.  Next A1C anticipated 04/07/17 or later.    ASCVD risk greater than 7.5%. Currently on moderate intensity statin. Not on aspirin.  -  Continued lovastatin 40 mg daily for now - Consider switching to high intensity at next follow up. -Consider start aspirin at next visit  Hypertension longstanding, currently controlled.  Patient reports adherence with medication.   -Continue current meds  GERD - controlled per patient, however per patient, would like to get off PPI. After discussion with Dr. Birdie Sons the following changes were made:  -Decrease esomeprazole to 20 mg once daily. Plan to continue for 1 month, then off.  -Start ranitidine 75 mg BID PRN   Patient was seen with Dr. Birdie Sons today in clinic and medication changes were discussed and approved prior to initiation  Written patient instructions provided.  Total time in face to face counseling 60 minutes.   Follow up in Pharmacist Clinic Visit 1 month.   Patient seen with Durward Mallard, PharmD Candidate.  Allena Katz, Pharm.D. PGY2 Ambulatory Care Pharmacy Resident Phone: [phone]

## 2017-01-20 NOTE — Patient Instructions (Addendum)
Thank you for coming to see [place] today!   1. Increase your Evaristo Bury to 25 units once a day. Increase your Evaristo Bury by 1 unit every day until your fasting blood sugar (first thing in the morning) is between 90-130. If you wake up and it is in the 80s, decrease your Tresiba by 1 unit.   2. Continue taking Humalog 8 units ONLY when you are eating.   3. Test your blood sugar first thing in the morning and 1-2 hours after a meal once a day. Write these down and bring in for our next appointment.   4. Decrease Nexium to 20 mg once a day for the next month. Take Ranitidine 75 mg twice a day AS NEEDED  for now. If it is not enough, call me and we can go up on the ranitidine.   5. Continue all of your other medications   6. Call me if you are having any problems. [phone] [place])  We will see you in ~ 1 month, I will call you next week to check on you.

## 2017-01-20 NOTE — Assessment & Plan Note (Signed)
Diabetes longstanding currently uncontrolled per A1C, in-office CBG check, and self reported CBGs. Patient reports hypoglycemic events and is able to verbalize appropriate hypoglycemia management plan after counseling. Patient reports adherence with medication. Control is suboptimal due to misunderstanding of insulin dosing instructions, insulin resistance, sedentary lifestyle.  Following discussion and approval by Dr. Birdie Sons, the following medication changes were made:  -Increased dose of basal insulin Tresiba (insulin deglucdec) to 25 units daily. Adjusted dose of rapid insulin Humalog (insulin lispro) to 8 units WITH MEALS only. -Continue Janumet 50-1000 mg BID -Increase CBG check to 2-3 times daily (Fasting and 2hr post prandial), provided with CBG log.  Next A1C anticipated 04/07/17 or later.    ASCVD risk greater than 7.5%. Currently on moderate intensity statin. Not on aspirin.  -Continued lovastatin 40 mg daily for now - Consider switching to high intensity at next follow up. -Consider start aspirin at next visit

## 2017-01-20 NOTE — Assessment & Plan Note (Signed)
GERD - controlled per patient, however per patient, would like to get off PPI. After discussion with Dr. Birdie Sons the following changes were made:  -Decrease esomeprazole to 20 mg once daily. Plan to continue for 1 month, then off.  -Start ranitidine 75 mg BID PRN

## 2017-02-10 ENCOUNTER — Ambulatory Visit: Payer: Medicare Other | Admitting: Podiatry

## 2017-02-10 DIAGNOSIS — R011 Cardiac murmur, unspecified: Secondary | ICD-10-CM | POA: Insufficient documentation

## 2017-02-10 DIAGNOSIS — Z6841 Body Mass Index (BMI) 40.0 and over, adult: Secondary | ICD-10-CM | POA: Insufficient documentation

## 2017-02-10 DIAGNOSIS — N183 Chronic kidney disease, stage 3 unspecified: Secondary | ICD-10-CM | POA: Insufficient documentation

## 2017-02-10 DIAGNOSIS — E1122 Type 2 diabetes mellitus with diabetic chronic kidney disease: Secondary | ICD-10-CM | POA: Insufficient documentation

## 2017-02-10 DIAGNOSIS — F330 Major depressive disorder, recurrent, mild: Secondary | ICD-10-CM | POA: Insufficient documentation

## 2017-02-10 DIAGNOSIS — Z794 Long term (current) use of insulin: Secondary | ICD-10-CM

## 2017-02-10 DIAGNOSIS — K2100 Gastro-esophageal reflux disease with esophagitis, without bleeding: Secondary | ICD-10-CM | POA: Insufficient documentation

## 2017-02-10 DIAGNOSIS — E1142 Type 2 diabetes mellitus with diabetic polyneuropathy: Secondary | ICD-10-CM | POA: Insufficient documentation

## 2017-02-10 DIAGNOSIS — D510 Vitamin B12 deficiency anemia due to intrinsic factor deficiency: Secondary | ICD-10-CM | POA: Insufficient documentation

## 2017-02-10 DIAGNOSIS — G43109 Migraine with aura, not intractable, without status migrainosus: Secondary | ICD-10-CM | POA: Insufficient documentation

## 2017-02-10 DIAGNOSIS — E160 Drug-induced hypoglycemia without coma: Secondary | ICD-10-CM | POA: Insufficient documentation

## 2017-02-10 DIAGNOSIS — T383X5A Adverse effect of insulin and oral hypoglycemic [antidiabetic] drugs, initial encounter: Secondary | ICD-10-CM

## 2017-02-10 DIAGNOSIS — E119 Type 2 diabetes mellitus without complications: Secondary | ICD-10-CM | POA: Insufficient documentation

## 2017-02-10 DIAGNOSIS — E1149 Type 2 diabetes mellitus with other diabetic neurological complication: Secondary | ICD-10-CM | POA: Insufficient documentation

## 2017-02-10 DIAGNOSIS — G894 Chronic pain syndrome: Secondary | ICD-10-CM | POA: Insufficient documentation

## 2017-02-17 ENCOUNTER — Telehealth: Payer: Self-pay | Admitting: Family Medicine

## 2017-02-17 MED ORDER — GLUCOSE BLOOD VI STRP: ORAL_STRIP | 6 refills | Status: AC

## 2017-02-17 NOTE — Telephone Encounter (Signed)
Pt needs a refill for her test stripes, Accucheck. Please call into pharmacy.

## 2017-02-17 NOTE — Telephone Encounter (Signed)
Sent to pharmacy 

## 2017-02-24 ENCOUNTER — Ambulatory Visit: Payer: Medicare Other | Admitting: Pharmacist

## 2017-02-24 ENCOUNTER — Other Ambulatory Visit: Payer: Self-pay | Admitting: Family Medicine

## 2017-02-24 NOTE — Progress Notes (Deleted)
S:     No chief complaint on file.   Patient arrives in fair spirits, ambulating without assistance.  Presents for diabetes evaluation, education, and management at the request of Dr. Birdie SonsSonnenberg. Patient was referred on 01/06/17.  Patient was last seen by Primary Care Provider on 01/06/17. Last Rx Clinic visit on 01/20/2017 - at that time it was discovered that patient had been taking [place] 15 units daily instead of 50 units daily as instructed, additionally, she was taking rapid acting insulin without regard to meals. Evaristo Buryresiba was increased to 25 units daily + titrate by 1 unit until fastings 90-130 and novolog was adjusted to 8 units with meals. Additionally, PPI taper was started at patient request.   Today, patient reports *** Other statin ***  Baby aspirin ***  Patient reports Diabetes was diagnosed in 1999.   Family/Social History: works nights, son has DM and has had DKA and near-amputation. Cares for 527 month old grandchild  [employer] offic[employer]urance coverage/medication affordability: ***  Patient reports adherence with medications *** Current diabetes medications include: Tresiba *** units daily, humalog 8 units TID, Janumet 50-1000 mg daily Current hypertension medications include: lisinopril - HCTZ  Patient {Actions; denies-reports:120008} hypoglycemic events.  Patient reported dietary habits: Eats 2 meals/day Breakfast:coffee Lunch:sandwich  Or salad Dinner: ham and cheese roll up  Snacks: not many Drinks: water, coffee  Patient reported exercise habits: 367 month old baby, no formal exercise.    Patient denies nocturia.  Patient denies neuropathy when she takes gabapentin regularly.  Patient reports visual changes since change in insulin Patient reports self foot exams. Callous on L foot - not worrisome.   O:  Physical Exam   ROS   Lab Results  Component Value Date   HGBA1C 9.6 01/06/2017   There were no vitals filed for this visit. Lipid Panel     Component Value  Date/Time   CHOL 191 09/30/2016 1045   TRIG 117.0 09/30/2016 1045   HDL 67.70 09/30/2016 1045   CHOLHDL 3 09/30/2016 1045   VLDL 23.4 09/30/2016 1045   LDLCALC 100 (H) 09/30/2016 1045    Home fasting CBG: ***  2 hour post-prandial/random CBG: ***.  10 year ASCVD risk: 10.0%  A/P: Diabetes longstanding currently uncontrolled per A1C, in-office CBG check, and self reported CBGs. Patient reports hypoglycemic events and is able to verbalize appropriate hypoglycemia management plan after counseling. Patient reports adherence with medication. Control is suboptimal due to misunderstanding of insulin dosing instructions, insulin resistance, sedentary lifestyle.  Following discussion and approval by Dr. Birdie SonsSonnenberg, the following medication changes were made:  -*** dose of basal insulin Tresiba (insulin degludec) to *** units daily, titrate by 1 unit until FBG between 90-130. Adjusted dose of rapid insulin Humalog (insulin lispro) to 8 units WITH MEALS only. -Continue Janumet 50-1000 mg BID -Increase CBG check to 2-3 times daily (Fasting and 2hr post prandial), provided with CBG log.  Next A1C anticipated 04/07/17 or later.    ASCVD risk greater than 7.5%. Currently on moderate intensity statin. Not on aspirin.  -Continued lovastatin 40 mg daily for now - Consider switching to high intensity at next follow up. -Consider start aspirin at next visit  Hypertension longstanding, currently controlled.  Patient reports adherence with medication.  -Continue current meds  GERD - controlled per patient, however per patient, would like to get off PPI. After discussion with Dr. Birdie SonsSonnenberg the following changes were made:  -Decrease esomeprazole to 20 mg once daily. Plan to continue for 1 month, then  off.  -Start ranitidine 75 mg BID PRN   Patient was seen with Dr. Birdie Sons today in clinic and medication changes were discussed and approved prior to initiation  Written patient instructions  provided. Total time in face to face counseling *** minutes.   Follow up in Pharmacist Clinic Visit ***.     Allena Katz, Pharm.D. PGY2 Ambulatory Care Pharmacy Resident Phone: [phone]

## 2017-02-25 ENCOUNTER — Other Ambulatory Visit: Payer: Self-pay | Admitting: Family Medicine

## 2017-03-01 NOTE — Progress Notes (Deleted)
Cardiology Office Note  Date:  03/01/2017   ID:  Tina Blankenship, DOB 09/12/1950, MRN [id number]  PCP:  Glori Luis, MD   No chief complaint on file.   HPI:   HTN hyperlipidemia Diabetes, HBA1C 9.6 Smoker palpitations Obesity  Patient notes for many years since she was young she will occasionally feel a thump in her chest. It will be brief. Slightly more frequent recently. Occurs every few days. No syncope. Does have family history of heart disease. Reports a stress test in 2007 that was normal.  Patient is on lovastatin for her cholesterol.  PMH: Former smoker    PMH:   has a past medical history of Asthma; Chicken pox; Chronic kidney disease; Depression; Diabetes mellitus; Diabetic neuropathy (HCC); GERD (gastroesophageal reflux disease); Heart murmur; Hyperlipidemia; Hypertension; Hypothyroidism; Migraines; and Urine incontinence.  PSH:    Past Surgical History:  Procedure Laterality Date  . FOOT SURGERY    . TUBAL LIGATION      Current Outpatient Prescriptions  Medication Sig Dispense Refill  . albuterol (PROVENTIL HFA) 108 (90 Base) MCG/ACT inhaler Inhale into the lungs.    . cyanocobalamin (,VITAMIN B-12,) 1000 MCG/ML injection INJECT INTRAMUSCULARLY ONCE A MONTH 10 mL 2  . DULoxetine (CYMBALTA) 60 MG capsule TAKE 1 CAPSULE (60 MG TOTAL) BY MOUTH 2 (TWO) TIMES DAILY. 180 capsule 1  . esomeprazole (NEXIUM) 20 MG capsule Take 1 capsule (20 mg total) by mouth daily. 30 capsule 2  . ferrous sulfate 325 (65 FE) MG tablet TAKE 1 TABLET (325 MG TOTAL) BY MOUTH 2 (TWO) TIMES DAILY. 60 tablet 3  . fluticasone (FLOVENT HFA) 44 MCG/ACT inhaler Inhale 1 puff into the lungs daily as needed.     . gabapentin (NEURONTIN) 800 MG tablet 800 mg in the am, 800 mg  in the afternoon, and 1600 mg in the evening. 360 tablet 3  . glucose blood test strip Use as instructed 100 each 6  . HUMALOG MIX 75/25 KWIKPEN (75-25) 100 UNIT/ML Kwikpen INJECT 36 UNITS IN THE MORNING AND 40  UNITS IN THE EVENING  11  . insulin degludec (TRESIBA FLEXTOUCH) 100 UNIT/ML SOPN FlexTouch Pen Inject 0.25 mLs (25 Units total) into the skin daily at 10 pm. 15 mL 3  . insulin lispro (HUMALOG KWIKPEN) 100 UNIT/ML KiwkPen Inject 0.08 mLs (8 Units total) into the skin 3 (three) times daily with meals. Only take when eating a meal. 5 pen 3  . Insulin Pen Needle (B-D ULTRAFINE III SHORT PEN) 31G X 8 MM MISC Use 4 times daily with insulin 120 each 3  . JANUMET 50-1000 MG tablet TAKE 1 TABLET BY MOUTH 2 (TWO) TIMES DAILY. (Patient taking differently: Take 1 tablet by mouth daily. ) 180 tablet 2  . lisinopril-hydrochlorothiazide (PRINZIDE,ZESTORETIC) 20-12.5 MG tablet TAKE 1 TABLET BY MOUTH DAILY. 90 tablet 3  . lovastatin (MEVACOR) 40 MG tablet Take 1 tablet (40 mg total) by mouth daily. 90 tablet 3  . montelukast (SINGULAIR) 10 MG tablet TAKE 1 TABLET (10 MG TOTAL) BY MOUTH DAILY. 90 tablet 1  . ranitidine (ZANTAC 75) 75 MG tablet Take 1 tablet (75 mg total) by mouth 2 (two) times daily as needed for heartburn. 60 tablet 3  . SYNTHROID 50 MCG tablet TAKE 1 TABLET (50 MCG TOTAL) BY MOUTH DAILY. 90 tablet 3   No current facility-administered medications for this visit.      Allergies:   Patient has no known allergies.   Social History:  The patient  reports that she has quit smoking. She has never used smokeless tobacco. She reports that she does not drink alcohol or use drugs.   Family History:   family history includes Alcohol abuse in her father; Arthritis in her maternal grandfather, maternal grandmother, and mother; Diabetes in her father and paternal grandmother; Heart disease in her maternal grandmother and mother; Hyperlipidemia in her maternal grandfather; Hypertension in her maternal grandmother; Pancreatic cancer in her maternal grandmother; Prostate cancer in her maternal grandfather; Stroke in her paternal grandfather; Sudden death in her brother.    Review of  Systems: ROS   PHYSICAL EXAM: VS:  There were no vitals taken for this visit. , BMI There is no height or weight on file to calculate BMI. GEN: Well nourished, well developed, in no acute distress  HEENT: normal  Neck: no JVD, carotid bruits, or masses Cardiac: RRR; no murmurs, rubs, or gallops,no edema  Respiratory:  clear to auscultation bilaterally, normal work of breathing GI: soft, nontender, nondistended, + BS MS: no deformity or atrophy  Skin: warm and dry, no rash Neuro:  Strength and sensation are intact Psych: euthymic mood, full affect    Recent Labs: 09/30/2016: ALT 13; BUN 25; Creatinine, Ser 1.23; Hemoglobin 11.7; Platelets 325.0; Potassium 4.4; Sodium 137; TSH 2.11    Lipid Panel Lab Results  Component Value Date   CHOL 191 09/30/2016   HDL 67.70 09/30/2016   LDLCALC 100 (H) 09/30/2016   TRIG 117.0 09/30/2016      Wt Readings from Last 3 Encounters:  01/20/17 262 lb 6.4 oz (119 kg)  01/06/17 258 lb 9.6 oz (117.3 kg)  09/30/16 252 lb (114.3 kg)       ASSESSMENT AND PLAN:  No diagnosis found.   Disposition:   F/U  6 months  No orders of the defined types were placed in this encounter.    Signed, Dossie Arbourim Gollan, M.D., Ph.D. 03/01/2017  [facility], [place] [phone]

## 2017-03-04 ENCOUNTER — Ambulatory Visit: Payer: Medicare Other | Admitting: Cardiovascular Disease

## 2017-03-10 ENCOUNTER — Ambulatory Visit: Payer: Medicare Other | Admitting: Pharmacist

## 2017-03-13 ENCOUNTER — Ambulatory Visit: Payer: Medicare Other | Admitting: Podiatry

## 2017-03-14 ENCOUNTER — Ambulatory Visit (INDEPENDENT_AMBULATORY_CARE_PROVIDER_SITE_OTHER): Payer: Medicare Other

## 2017-03-14 ENCOUNTER — Encounter: Payer: Self-pay | Admitting: Podiatry

## 2017-03-14 ENCOUNTER — Ambulatory Visit: Payer: Medicare Other | Admitting: Podiatry

## 2017-03-14 DIAGNOSIS — M2042 Other hammer toe(s) (acquired), left foot: Secondary | ICD-10-CM | POA: Diagnosis not present

## 2017-03-14 DIAGNOSIS — M2041 Other hammer toe(s) (acquired), right foot: Secondary | ICD-10-CM

## 2017-03-14 DIAGNOSIS — L989 Disorder of the skin and subcutaneous tissue, unspecified: Secondary | ICD-10-CM | POA: Diagnosis not present

## 2017-03-14 DIAGNOSIS — E11610 Type 2 diabetes mellitus with diabetic neuropathic arthropathy: Secondary | ICD-10-CM

## 2017-03-14 DIAGNOSIS — E0843 Diabetes mellitus due to underlying condition with diabetic autonomic (poly)neuropathy: Secondary | ICD-10-CM

## 2017-03-15 NOTE — Progress Notes (Signed)
   Subjective: Patient with a history of diabetes mellitus presents to the office today for chief complaint of painful callus lesions of the feet. Patient states that the pain is ongoing and is affecting their ability to ambulate without pain. Patient presents today for further treatment and evaluation. 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 She has had Charcot reconstructive surgery performed to the right foot. She presents today for further treatment and evaluation  Objective:  Physical Exam General: Alert and oriented x3 in no acute distress  Dermatology: Hyperkeratotic lesion present on the bilateral feet. Pain on palpation with a central nucleated core noted.  Skin is warm, dry and supple bilateral lower extremities. Negative for open lesions or macerations.  Vascular: Palpable pedal pulses bilaterally. No edema or erythema noted. Capillary refill within normal limits.  Neurological: Epicritic and protective threshold grossly intact bilaterally.   Musculoskeletal Exam: Pain on palpation at the keratotic lesion noted. Range of motion within normal limits bilateral. Muscle strength 5/5 in all groups bilateral.  Radiographic exam: Orthopedic hardware appears to be intact within the right osseous structures consistent with a Charcot reconstructive-type surgery. Intramedullary rod with internal fixation is visualized. Degenerative changes noted throughout the pedal joints of the bilateral feet  Assessment: #1 pre-ulcerative callous lesions bilateral 2 #2 history of diabetic Charcot neuroarthropathy bilateral feet #3 hammertoes 1-5 bilateral #4 diabetes mellitus with polyneuropathy   Plan of Care:  #1 Patient evaluated #2 Excisional debridement of keratoic lesion using a chisel blade was performed without incident.  #3 appointment with Raiford Nobleick for diabetic shoes and insoles #4 Patient is to return to the clinic PRN.     Felecia ShellingBrent M. Leanard Dimaio, DPM [facility]  Dr. Felecia ShellingBrent M. Kellina Dreese, DPM    [address]                                        [place], [place] [address]                Office [phone]  Fax [phone]

## 2017-03-28 ENCOUNTER — Other Ambulatory Visit: Payer: Self-pay

## 2017-03-28 MED ORDER — GABAPENTIN 800 MG PO TABS: ORAL_TABLET | ORAL | 3 refills | Status: DC

## 2017-03-28 NOTE — Telephone Encounter (Signed)
Last OV 01/06/17 last filled by Dr.Cook 09/30/16 [phone],

## 2017-04-01 ENCOUNTER — Ambulatory Visit: Payer: Medicare Other | Admitting: Orthotics

## 2017-04-23 ENCOUNTER — Ambulatory Visit: Payer: Medicare Other | Admitting: Orthotics

## 2017-05-08 NOTE — Progress Notes (Deleted)
Cardiology Office Note  Date:  05/08/2017   ID:  Tina Blankenship, DOB [birth year], MRN [id number]  PCP:  Glori Luis, MD   No chief complaint on file.   HPI:    Diabetes, HBA1C 9.6 gerd HTN Hx of smoking Hyperlipidemia Syncope 2010, normal echo at that time Referred by Dr. Birdie Sons for palpitations  Felt by PMD to be possible PVCs  Normal ABIs 12/2016    PMH:   has a past medical history of Asthma, Chicken pox, Chronic kidney disease, Depression, Diabetes mellitus, Diabetic neuropathy (HCC), GERD (gastroesophageal reflux disease), Heart murmur, Hyperlipidemia, Hypertension, Hypothyroidism, Migraines, and Urine incontinence.  PSH:    Past Surgical History:  Procedure Laterality Date  . FOOT SURGERY    . TUBAL LIGATION      Current Outpatient Medications  Medication Sig Dispense Refill  . albuterol (PROVENTIL HFA) 108 (90 Base) MCG/ACT inhaler Inhale into the lungs.    . cyanocobalamin (,VITAMIN B-12,) 1000 MCG/ML injection INJECT INTRAMUSCULARLY ONCE A MONTH 10 mL 2  . DULoxetine (CYMBALTA) 60 MG capsule TAKE 1 CAPSULE (60 MG TOTAL) BY MOUTH 2 (TWO) TIMES DAILY. 180 capsule 1  . esomeprazole (NEXIUM) 20 MG capsule Take 1 capsule (20 mg total) by mouth daily. 30 capsule 2  . ferrous sulfate 325 (65 FE) MG tablet TAKE 1 TABLET (325 MG TOTAL) BY MOUTH 2 (TWO) TIMES DAILY. 60 tablet 3  . fluticasone (FLOVENT HFA) 44 MCG/ACT inhaler Inhale 1 puff into the lungs daily as needed.     . gabapentin (NEURONTIN) 800 MG tablet 800 mg in the am, 800 mg  in the afternoon, and 1600 mg in the evening. 360 tablet 3  . glucose blood test strip Use as instructed 100 each 6  . HUMALOG MIX 75/25 KWIKPEN (75-25) 100 UNIT/ML Kwikpen INJECT 36 UNITS IN THE MORNING AND 40 UNITS IN THE EVENING  11  . insulin degludec (TRESIBA FLEXTOUCH) 100 UNIT/ML SOPN FlexTouch Pen Inject 0.25 mLs (25 Units total) into the skin daily at 10 pm. 15 mL 3  . insulin lispro (HUMALOG KWIKPEN) 100 UNIT/ML  KiwkPen Inject 0.08 mLs (8 Units total) into the skin 3 (three) times daily with meals. Only take when eating a meal. 5 pen 3  . Insulin Pen Needle (B-D ULTRAFINE III SHORT PEN) 31G X 8 MM MISC Use 4 times daily with insulin 120 each 3  . JANUMET 50-1000 MG tablet TAKE 1 TABLET BY MOUTH 2 (TWO) TIMES DAILY. (Patient taking differently: Take 1 tablet by mouth daily. ) 180 tablet 2  . lisinopril-hydrochlorothiazide (PRINZIDE,ZESTORETIC) 20-12.5 MG tablet TAKE 1 TABLET BY MOUTH DAILY. 90 tablet 3  . lovastatin (MEVACOR) 40 MG tablet Take 1 tablet (40 mg total) by mouth daily. 90 tablet 3  . montelukast (SINGULAIR) 10 MG tablet TAKE 1 TABLET (10 MG TOTAL) BY MOUTH DAILY. 90 tablet 1  . ranitidine (ZANTAC 75) 75 MG tablet Take 1 tablet (75 mg total) by mouth 2 (two) times daily as needed for heartburn. 60 tablet 3  . SYNTHROID 50 MCG tablet TAKE 1 TABLET (50 MCG TOTAL) BY MOUTH DAILY. 90 tablet 3   No current facility-administered medications for this visit.      Allergies:   Patient has no known allergies.   Social History:  The patient  reports that she has quit smoking. she has never used smokeless tobacco. She reports that she does not drink alcohol or use drugs.   Family History:   family history includes Alcohol abuse  in her father; Arthritis in her maternal grandfather, maternal grandmother, and mother; Diabetes in her father and paternal grandmother; Heart disease in her maternal grandmother and mother; Hyperlipidemia in her maternal grandfather; Hypertension in her maternal grandmother; Pancreatic cancer in her maternal grandmother; Prostate cancer in her maternal grandfather; Stroke in her paternal grandfather; Sudden death in her brother.    Review of Systems: ROS   PHYSICAL EXAM: VS:  There were no vitals taken for this visit. , BMI There is no height or weight on file to calculate BMI. GEN: Well nourished, well developed, in no acute distress  HEENT: normal  Neck: no JVD, carotid  bruits, or masses Cardiac: RRR; no murmurs, rubs, or gallops,no edema  Respiratory:  clear to auscultation bilaterally, normal work of breathing GI: soft, nontender, nondistended, + BS MS: no deformity or atrophy  Skin: warm and dry, no rash Neuro:  Strength and sensation are intact Psych: euthymic mood, full affect    Recent Labs: 09/30/2016: ALT 13; BUN 25; Creatinine, Ser 1.23; Hemoglobin 11.7; Platelets 325.0; Potassium 4.4; Sodium 137; TSH 2.11    Lipid Panel Lab Results  Component Value Date   CHOL 191 09/30/2016   HDL 67.70 09/30/2016   LDLCALC 100 (H) 09/30/2016   TRIG 117.0 09/30/2016      Wt Readings from Last 3 Encounters:  01/20/17 262 lb 6.4 oz (119 kg)  01/06/17 258 lb 9.6 oz (117.3 kg)  09/30/16 252 lb (114.3 kg)       ASSESSMENT AND PLAN:  No diagnosis found.   Disposition:   F/U  6 months  No orders of the defined types were placed in this encounter.    Signed, Dossie Arbourim Khoa Opdahl, M.D., Ph.D. 05/08/2017  [facility] [place] [phone]

## 2017-05-09 ENCOUNTER — Ambulatory Visit: Payer: Medicare Other | Admitting: Cardiovascular Disease

## 2017-05-13 ENCOUNTER — Other Ambulatory Visit: Payer: Self-pay | Admitting: Family Medicine

## 2017-05-19 ENCOUNTER — Other Ambulatory Visit: Payer: Self-pay | Admitting: Family Medicine

## 2017-05-20 ENCOUNTER — Telehealth: Payer: Self-pay | Admitting: Family Medicine

## 2017-05-20 DIAGNOSIS — K219 Gastro-esophageal reflux disease without esophagitis: Secondary | ICD-10-CM

## 2017-05-20 NOTE — Telephone Encounter (Signed)
Medication was D/C in October.Please advise.

## 2017-05-20 NOTE — Telephone Encounter (Signed)
Copied from CRM [phone]. Topic: Quick Communication - See Telephone Encounter >> May 20, 2017  4:26 PM Windy KalataMichael, [name], NT wrote: CRM for notification. See Telephone encounter for:  05/20/17.  Pt is calling and stating since the fall she has been trying to get off of Nexium. On Sunday 05/18/17 her son passed away and she is having a really hard time and would like to know if Dr. Birdie SonsSonnenberg would refill her Nexium 20mg . Please advise.  [facility].

## 2017-05-21 MED ORDER — ESOMEPRAZOLE MAGNESIUM 20 MG PO CPDR: 20.0000 mg | DELAYED_RELEASE_CAPSULE | Freq: Every day | ORAL | 2 refills | Status: DC

## 2017-05-21 NOTE — Telephone Encounter (Signed)
FYI

## 2017-05-21 NOTE — Telephone Encounter (Signed)
Tried calling no vm. Ok for pec to speak to patient and also find out what symptoms she is having, Dr.Sonnenberg: Nexium sent back to pharmacy.  Please find out what symptoms she is having.  Please give her my condolences regarding her son.  Please see how she has been doing with that and if she needs any counseling or support.  Thanks.

## 2017-05-21 NOTE — Telephone Encounter (Signed)
Noted.  I would suggest that she be seen if symptoms particularly if her stomach is hurting.  Urgent care would be an option for evaluation.  If she wants to see me we can try to get her scheduled though if things worsen she does need to be evaluated sooner.

## 2017-05-21 NOTE — Telephone Encounter (Signed)
Patient returned call, I read to her the note from Dr. Birdie SonsSonnenberg 05/21/17, she said "I don't need to see anyone right now, it is all numb. Maybe later on." I asked what kind of symptoms is she having to request her nexium, she said "I have acid reflux and my stomach burns, hurts even with drinking water. I can't lie down on my stomach it hurts so bad." I informed her that [facility], she verbalized understanding. I asked does she need an appointment, she said not at this time.

## 2017-05-21 NOTE — Telephone Encounter (Signed)
This encounter was created in error - please disregard.

## 2017-05-21 NOTE — Telephone Encounter (Signed)
fyi

## 2017-05-21 NOTE — Telephone Encounter (Signed)
Patient states she was on nexium and was changed to another medication which did not help. Patient states she would like to go back on nexium, she states she is ok being on the lower dose.

## 2017-05-21 NOTE — Telephone Encounter (Signed)
Patient states she is going out of town tomorrow for her sons funeral, informed patient to be evaluated if symptoms worsen or do not improve

## 2017-05-21 NOTE — Telephone Encounter (Signed)
Nexium sent back to pharmacy.  Please find out what symptoms she is having.  Please give her my condolences regarding her son.  Please see how she has been doing with that and if she needs any counseling or support.  Thanks.

## 2017-07-12 ENCOUNTER — Other Ambulatory Visit: Payer: Self-pay | Admitting: Family Medicine

## 2017-07-13 ENCOUNTER — Other Ambulatory Visit: Payer: Self-pay | Admitting: Family Medicine

## 2017-07-14 NOTE — Telephone Encounter (Signed)
Sent to pharmacy.  She needs an appointment for follow-up.

## 2017-07-14 NOTE — Telephone Encounter (Signed)
Last seen by Dr. Adriana Simasook in June. No future appt scheduled. But you are now listed as PCP.

## 2017-07-15 ENCOUNTER — Encounter: Payer: Self-pay | Admitting: *Deleted

## 2017-07-15 NOTE — Telephone Encounter (Signed)
Mailed letter °

## 2017-08-26 ENCOUNTER — Other Ambulatory Visit: Payer: Self-pay | Admitting: Family Medicine

## 2017-08-28 ENCOUNTER — Other Ambulatory Visit: Payer: Self-pay | Admitting: Family Medicine

## 2017-09-18 ENCOUNTER — Other Ambulatory Visit: Payer: Self-pay | Admitting: Family Medicine

## 2017-09-18 NOTE — Telephone Encounter (Signed)
Last seen 12/2016 by Dr. Birdie Sons

## 2017-09-19 NOTE — Telephone Encounter (Signed)
Refill sent to pharmacy.  She needs to be scheduled for follow-up.  Thanks.

## 2017-09-24 NOTE — Telephone Encounter (Signed)
Left message to return call. Ok for pec to speak to patient and schedule follow up for further medication refills

## 2017-10-07 NOTE — Telephone Encounter (Signed)
Sent letter to call office and schedule appointment

## 2018-01-13 ENCOUNTER — Other Ambulatory Visit: Payer: Self-pay | Admitting: Internal Medicine

## 2018-01-13 DIAGNOSIS — Z1231 Encounter for screening mammogram for malignant neoplasm of breast: Secondary | ICD-10-CM

## 2018-02-02 ENCOUNTER — Ambulatory Visit
Admission: RE | Admit: 2018-02-02 | Discharge: 2018-02-02 | Disposition: A | Discharge status: Home / Self Care | Payer: Medicare (Managed Care) | Source: Ambulatory Visit | Attending: Internal Medicine | Admitting: Internal Medicine

## 2018-02-02 DIAGNOSIS — Z1231 Encounter for screening mammogram for malignant neoplasm of breast: Secondary | ICD-10-CM | POA: Diagnosis not present

## 2019-05-03 ENCOUNTER — Encounter: Payer: Self-pay | Admitting: *Deleted

## 2019-05-10 ENCOUNTER — Other Ambulatory Visit
Admission: RE | Admit: 2019-05-10 | Discharge: 2019-05-10 | Disposition: A | Discharge status: Home / Self Care | Payer: Medicare (Managed Care) | Source: Ambulatory Visit | Attending: Ophthalmology | Admitting: Ophthalmology

## 2019-05-10 ENCOUNTER — Other Ambulatory Visit: Payer: Self-pay

## 2019-05-10 DIAGNOSIS — Z01812 Encounter for preprocedural laboratory examination: Secondary | ICD-10-CM | POA: Diagnosis present

## 2019-05-10 DIAGNOSIS — Z20822 Contact with and (suspected) exposure to covid-19: Secondary | ICD-10-CM | POA: Diagnosis not present

## 2019-05-10 LAB — SARS CORONAVIRUS 2 (TAT 6-24 HRS): SARS Coronavirus 2: NEGATIVE

## 2019-05-11 ENCOUNTER — Other Ambulatory Visit: Payer: Medicare (Managed Care)

## 2019-05-13 ENCOUNTER — Ambulatory Visit
Admission: RE | Admit: 2019-05-13 | Discharge: 2019-05-13 | Disposition: A | Discharge status: Home / Self Care | Payer: Medicare (Managed Care) | Attending: Ophthalmology | Admitting: Ophthalmology

## 2019-05-13 ENCOUNTER — Encounter
Admission: RE | Disposition: A | Discharge status: Home / Self Care | Payer: Self-pay | Source: Home / Self Care | Attending: Ophthalmology

## 2019-05-13 ENCOUNTER — Encounter: Payer: Self-pay | Admitting: Certified Registered Nurse Anesthetist

## 2019-05-13 DIAGNOSIS — Z5309 Procedure and treatment not carried out because of other contraindication: Secondary | ICD-10-CM | POA: Insufficient documentation

## 2019-05-13 DIAGNOSIS — H269 Unspecified cataract: Secondary | ICD-10-CM | POA: Insufficient documentation

## 2019-05-13 HISTORY — DX: Gastric ulcer, unspecified as acute or chronic, without hemorrhage or perforation: K25.9

## 2019-05-13 HISTORY — DX: Dizziness and giddiness: R42

## 2019-05-13 HISTORY — DX: Personal history of other diseases of the digestive system: Z87.19

## 2019-05-13 HISTORY — DX: Personal history of urinary calculi: Z87.442

## 2019-05-13 HISTORY — DX: Obesity, unspecified: E66.9

## 2019-05-13 HISTORY — DX: Myoneural disorder, unspecified: G70.9

## 2019-05-13 HISTORY — DX: Unspecified osteoarthritis, unspecified site: M19.90

## 2019-05-13 HISTORY — DX: Anemia, unspecified: D64.9

## 2019-05-13 SURGERY — PHACOEMULSIFICATION, CATARACT, WITH IOL INSERTION
Anesthesia: Topical

## 2019-05-13 SURGICAL SUPPLY — 16 items
DISSECTOR HYDRO NUCLEUS 50X22 (MISCELLANEOUS) ×8 IMPLANT
DRSG TEGADERM 2-3/8X2-3/4 SM (GAUZE/BANDAGES/DRESSINGS) ×2 IMPLANT
GLOVE BIOGEL M 6.5 STRL (GLOVE) ×2 IMPLANT
GOWN STRL REUS W/ TWL LRG LVL3 (GOWN DISPOSABLE) ×2 IMPLANT
GOWN STRL REUS W/ TWL XL LVL3 (GOWN DISPOSABLE) ×2 IMPLANT
GOWN STRL REUS W/TWL LRG LVL3 (GOWN DISPOSABLE) ×2
GOWN STRL REUS W/TWL XL LVL3 (GOWN DISPOSABLE) ×2
KNIFE 45D UP 2.3 (MISCELLANEOUS) ×2 IMPLANT
LABEL CATARACT MEDS ST (LABEL) ×2 IMPLANT
PACK CATARACT (MISCELLANEOUS) ×3 IMPLANT
PACK CATARACT KING (MISCELLANEOUS) ×2 IMPLANT
PACK EYE AFTER SURG (MISCELLANEOUS) ×2 IMPLANT
SOL BSS BAG (MISCELLANEOUS) ×2
SOLUTION BSS BAG (MISCELLANEOUS) ×2 IMPLANT
WATER STERILE IRR 250ML POUR (IV SOLUTION) ×2 IMPLANT
WIPE NON LINTING 3.25X3.25 (MISCELLANEOUS) ×3 IMPLANT

## 2019-05-13 NOTE — H&P (Signed)
   I have reviewed the patient's H&P and agree with its findings. There have been no interval changes.  Drayce Tawil MD Ophthalmology 

## 2019-05-13 NOTE — Progress Notes (Signed)
Pt reports having eating a wurthers candy at 0700,  Dr Suzan Slick notified, dr Lara Mulch notified.  Pt to be cancelled today and rescheduled.

## 2019-05-18 ENCOUNTER — Other Ambulatory Visit: Payer: Self-pay | Admitting: Family Medicine

## 2019-05-18 DIAGNOSIS — L97519 Non-pressure chronic ulcer of other part of right foot with unspecified severity: Secondary | ICD-10-CM

## 2019-05-18 DIAGNOSIS — R600 Localized edema: Secondary | ICD-10-CM

## 2019-05-24 ENCOUNTER — Ambulatory Visit
Admission: RE | Admit: 2019-05-24 | Discharge: 2019-05-24 | Disposition: A | Discharge status: Home / Self Care | Payer: Medicare (Managed Care) | Source: Ambulatory Visit | Attending: Family Medicine | Admitting: Family Medicine

## 2019-05-24 ENCOUNTER — Other Ambulatory Visit: Payer: Self-pay

## 2019-05-24 DIAGNOSIS — R600 Localized edema: Secondary | ICD-10-CM | POA: Diagnosis present

## 2019-05-26 ENCOUNTER — Ambulatory Visit: Admission: RE | Admit: 2019-05-26 | Payer: Medicare (Managed Care) | Source: Ambulatory Visit

## 2019-06-01 ENCOUNTER — Ambulatory Visit: Payer: Medicare (Managed Care)

## 2019-06-03 ENCOUNTER — Other Ambulatory Visit: Payer: Self-pay

## 2019-06-03 ENCOUNTER — Encounter: Payer: Self-pay | Admitting: Ophthalmology

## 2019-06-07 ENCOUNTER — Ambulatory Visit: Payer: Medicare (Managed Care)

## 2019-06-08 ENCOUNTER — Other Ambulatory Visit: Admission: RE | Admit: 2019-06-08 | Payer: Medicare (Managed Care) | Source: Ambulatory Visit

## 2019-06-21 ENCOUNTER — Ambulatory Visit: Payer: Medicare (Managed Care)

## 2019-06-23 ENCOUNTER — Ambulatory Visit: Payer: Medicare (Managed Care)

## 2019-06-23 ENCOUNTER — Other Ambulatory Visit: Payer: Self-pay | Admitting: Family Medicine

## 2019-06-23 DIAGNOSIS — R600 Localized edema: Secondary | ICD-10-CM

## 2019-06-23 DIAGNOSIS — L97519 Non-pressure chronic ulcer of other part of right foot with unspecified severity: Secondary | ICD-10-CM

## 2019-06-24 ENCOUNTER — Ambulatory Visit: Admission: RE | Admit: 2019-06-24 | Payer: Medicare (Managed Care) | Source: Ambulatory Visit

## 2019-08-09 ENCOUNTER — Encounter (INDEPENDENT_AMBULATORY_CARE_PROVIDER_SITE_OTHER): Payer: Medicare (Managed Care) | Admitting: Vascular Surgery

## 2019-08-19 ENCOUNTER — Other Ambulatory Visit: Payer: Self-pay

## 2019-08-19 ENCOUNTER — Ambulatory Visit (INDEPENDENT_AMBULATORY_CARE_PROVIDER_SITE_OTHER): Payer: Medicare (Managed Care) | Admitting: Vascular Surgery

## 2019-08-19 ENCOUNTER — Encounter (INDEPENDENT_AMBULATORY_CARE_PROVIDER_SITE_OTHER): Payer: Self-pay | Admitting: Vascular Surgery

## 2019-08-19 VITALS — BP 141/78 | HR 109 | Ht 65.0 in | Wt 248.0 lb

## 2019-08-19 DIAGNOSIS — I872 Venous insufficiency (chronic) (peripheral): Secondary | ICD-10-CM | POA: Diagnosis not present

## 2019-08-19 DIAGNOSIS — I739 Peripheral vascular disease, unspecified: Secondary | ICD-10-CM

## 2019-08-19 DIAGNOSIS — L97512 Non-pressure chronic ulcer of other part of right foot with fat layer exposed: Secondary | ICD-10-CM | POA: Diagnosis not present

## 2019-08-19 DIAGNOSIS — E1142 Type 2 diabetes mellitus with diabetic polyneuropathy: Secondary | ICD-10-CM

## 2019-08-19 DIAGNOSIS — I10 Essential (primary) hypertension: Secondary | ICD-10-CM

## 2019-08-19 NOTE — Progress Notes (Signed)
MRN : [id number]  Tina Blankenship is a 69 y.o. (02-08-1951) female who presents with chief complaint of  Chief Complaint  Patient presents with  . New Patient (Initial Visit)    RT Foot ulcer  .  History of Present Illness:   The patient is seen for evaluation of painful lower extremities and diminished pulses associated with ulceration of the foot.  The patient notes the ulcer has been present for multiple weeks and has not been improving.  It is very painful and has had some drainage.  No specific history of trauma noted by the patient.  The patient denies fever or chills.  the patient does have diabetes which has been difficult to control.  Patient notes prior to the ulcer developing the extremities were painful particularly with ambulation or activity and the discomfort is very consistent day today. Typically, the pain occurs at less than one block, progress is as activity continues to the point that the patient must stop walking. Resting including standing still for several minutes allowed resumption of the activity and the ability to walk a similar distance before stopping again. Uneven terrain and inclined shorten the distance. The pain has been progressive over the past several years.   The patient denies rest pain or dangling of an extremity off the side of the bed during the night for relief. No prior interventions or surgeries.  Venous duplex was negative for DVT  No history of back problems or DJD of the lumbar sacral spine.   The patient denies amaurosis fugax or recent TIA symptoms. There are no recent neurological changes noted. The patient denies history of DVT, PE or superficial thrombophlebitis. The patient denies recent episodes of angina or shortness of breath.   No outpatient medications have been marked as taking for the 08/19/19 encounter (Office Visit) with Gilda Crease, Latina Craver, MD.    Past Medical History:  Diagnosis Date  . Abscess of back 2004   lumbar  spine abscess, hospitalized about 1 month  . Anemia   . Arthritis    knees, joints  . Asthma    last attack year ago, allergy related  . Car sickness   . Chicken pox   . 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 Depression   . Diabetes mellitus    type 2  . Diabetic neuropathy (HCC)   . GERD (gastroesophageal reflux disease)   . Heart murmur   . History of hiatal hernia   . History of kidney stones   . Hyperlipidemia   . Hypertension   . Hypothyroidism   . Migraines    occasional  . Multiple gastric ulcers   . Neuromuscular disorder (HCC)    neuropathies  . Neuropathy    feet and hands  . Obesity   . Pernicious anemia   . Runny nose    cough/ probably allergies per patient  . Urine incontinence   . Vertigo    occasional    Past Surgical History:  Procedure Laterality Date  . FOOT SURGERY Right    multiple  . TUBAL LIGATION      Social History Social History   Tobacco Use  . Smoking status: Former Smoker    Packs/day: 0.50    Years: 10.00    Pack years: 5.00    Types: Cigarettes  . Smokeless tobacco: Never Used  . Tobacco comment: quit 20 years ago  Substance Use Topics  . Alcohol use: No  Alcohol/week: 0.0 standard drinks  . Drug use: No    Family History Family History  Problem Relation Age of Onset  . Arthritis Mother   . Heart disease Mother   . Alcohol abuse Father   . Diabetes Father   . Sudden death Brother   . Arthritis Maternal Grandmother   . Heart disease Maternal Grandmother   . Hypertension Maternal Grandmother   . Pancreatic cancer Maternal Grandmother   . Arthritis Maternal Grandfather   . Prostate cancer Maternal Grandfather   . Hyperlipidemia Maternal Grandfather   . Diabetes Paternal Grandmother   . Stroke Paternal Grandfather   . Breast cancer Neg Hx   No family history of bleeding/clotting disorders, porphyria or autoimmune disease   Allergies  Allergen Reactions  . Kiwi Fragrance [Kiwi  Extract] Swelling    Swelling of feet and legs  . Wasp Venom Swelling    Bees Swelling of feet and legs     REVIEW OF SYSTEMS (Negative unless checked)  Constitutional: [] Weight loss  [] Fever  [] Chills Cardiac: [] Chest pain   [] Chest pressure   [] Palpitations   [] Shortness of breath when laying flat   [] Shortness of breath with exertion. Vascular:  [x] Pain in legs with walking   [] Pain in legs at rest  [] History of DVT   [] Phlebitis   [x] Swelling in legs   [] Varicose veins   [] Non-healing ulcers Pulmonary:   [] Uses home oxygen   [] Productive cough   [] Hemoptysis   [] Wheeze  [] COPD   [] Asthma Neurologic:  [] Dizziness   [] Seizures   [] History of stroke   [] History of TIA  [] Aphasia   [] Vissual changes   [] Weakness or numbness in arm   [] Weakness or numbness in leg Musculoskeletal:   [] Joint swelling   [] Joint pain   [] Low back pain Hematologic:  [] Easy bruising  [] Easy bleeding   [] Hypercoagulable state   [] Anemic Gastrointestinal:  [] Diarrhea   [] Vomiting  [] Gastroesophageal reflux/heartburn   [] Difficulty swallowing. Genitourinary:  [] Chronic kidney disease   [] Difficult urination  [] Frequent urination   [] Blood in urine Skin:  [] Rashes   [] Ulcers  Psychological:  [] History of anxiety   []  History of major depression.  Physical Examination  Vitals:   08/19/19 1352  BP: (!) 141/78  Pulse: (!) 109  Weight: 248 lb (112.5 kg)  Height: 5\' 5"  (1.651 m)   Body mass index is 41.27 kg/m. Gen: WD/WN, NAD Head: [place]/AT, No temporalis wasting.  Ear/Nose/Throat: Hearing grossly intact, nares w/o erythema or drainage, poor dentition Eyes: PER, EOMI, sclera nonicteric.  Neck: Supple, no masses.  No bruit or JVD.  Pulmonary:  Good air movement, clear to auscultation bilaterally, no use of accessory muscles.  Cardiac: RRR, normal S1, S2, no Murmurs. Vascular: right heel ulcer noninfected Vessel Right Left  Radial Palpable Palpable  PT Palpable Trace  Palpable  DP Palpable 1+ Palpable    Gastrointestinal: soft, non-distended. No guarding/no peritoneal signs.  Musculoskeletal: M/S 5/5 throughout.  No deformity or atrophy.  Neurologic: CN 2-12 intact. Pain and light touch intact in extremities.  Symmetrical.  Speech is fluent. Motor exam as listed above. Psychiatric: Judgment intact, Mood & affect appropriate for pt's clinical situation. Dermatologic: No rashes or ulcers noted.  No changes consistent with cellulitis. Lymph : No Cervical lymphadenopathy, no lichenification or skin changes of chronic lymphedema.  CBC Lab Results  Component Value Date   WBC 10.2 09/30/2016   HGB 11.7 (L) 09/30/2016   HCT 35.7 (L) 09/30/2016   MCV 85.4 09/30/2016  PLT 325.0 09/30/2016    BMET    Component Value Date/Time   NA 137 09/30/2016 1045   K 4.4 09/30/2016 1045   CL 101 09/30/2016 1045   CO2 27 09/30/2016 1045   GLUCOSE 50 (L) 09/30/2016 1045   BUN 25 (H) 09/30/2016 1045   CREATININE 1.23 (H) 09/30/2016 1045   CALCIUM 10.2 09/30/2016 1045   GFRNONAA 40 (L) 11/04/2011 1000   GFRAA 47 (L) 11/04/2011 1000   CrCl cannot be calculated (Patient's most recent lab result is older than the maximum 21 days allowed.).  COAG Lab Results  Component Value Date   INR 1.2 01/07/2009    Radiology No results found.   Assessment/Plan 1. Ulcer of right foot with fat layer exposed (HCC) Recommend:  Patient should undergo arterial duplex of the lower extremity ASAP because there has been a significant deterioration in the patient's lower extremity symptoms.  The patient states they are having increased pain and a marked decrease in the distance that they can walk.  The risks and benefits as well as the alternatives were discussed in detail with the patient.  All questions were answered.  Patient agrees to proceed and understands this could be a prelude to angiography and intervention.  The patient will follow up with me in the office to review the studies.  - VAS [place] LOWER EXTREMITY  ARTERIAL DUPLEX; Future - VAS [place] ABI WITH/WO TBI; Future  2. PAD (peripheral artery disease) (HCC) Recommend:  Patient should undergo arterial duplex of the lower extremity ASAP because there has been a significant deterioration in the patient's lower extremity symptoms.  The patient states they are having increased pain and a marked decrease in the distance that they can walk.  The risks and benefits as well as the alternatives were discussed in detail with the patient.  All questions were answered.  Patient agrees to proceed and understands this could be a prelude to angiography and intervention.  The patient will follow up with me in the office to review the studies.  - VAS [place] LOWER EXTREMITY ARTERIAL DUPLEX; Future - VAS [place] ABI WITH/WO TBI; Future  3. Chronic venous insufficiency I have had a long discussion with the patient regarding swelling and why it  causes symptoms.  Patient will begin wearing graduated compression stockings class 1 (20-30 mmHg) on a daily basis a prescription was given. The patient will  beginning wearing the stockings first thing in the morning and removing them in the evening. The patient is instructed specifically not to sleep in the stockings.   In addition, behavioral modification will be initiated.  This will include frequent elevation, use of over the counter pain medications and exercise such as walking.  I have reviewed systemic causes for chronic edema such as liver, kidney and cardiac etiologies.  The patient denies problems with these organ systems.    Consideration for a lymph pump will also be made based upon the effectiveness of conservative therapy.  This would help to improve the edema control and prevent sequela such as ulcers and infections   4. DM type 2 with diabetic peripheral neuropathy (HCC) Continue hypoglycemic medications as already ordered, these medications have been reviewed and there are no changes at this time.  Hgb A1C to be  monitored as already arranged by primary service   5. Benign essential hypertension Continue antihypertensive medications as already ordered, these medications have been reviewed and there are no changes at this time.     Levora Dredge,  MD  08/19/2019 2:01 PM

## 2019-08-21 ENCOUNTER — Encounter (INDEPENDENT_AMBULATORY_CARE_PROVIDER_SITE_OTHER): Payer: Self-pay | Admitting: Vascular Surgery

## 2019-08-21 DIAGNOSIS — I739 Peripheral vascular disease, unspecified: Secondary | ICD-10-CM | POA: Insufficient documentation

## 2019-08-21 DIAGNOSIS — L97509 Non-pressure chronic ulcer of other part of unspecified foot with unspecified severity: Secondary | ICD-10-CM | POA: Insufficient documentation

## 2019-08-21 DIAGNOSIS — I872 Venous insufficiency (chronic) (peripheral): Secondary | ICD-10-CM | POA: Insufficient documentation

## 2019-08-23 ENCOUNTER — Encounter: Payer: Self-pay | Admitting: Ophthalmology

## 2019-08-23 ENCOUNTER — Other Ambulatory Visit: Payer: Self-pay

## 2019-08-24 ENCOUNTER — Other Ambulatory Visit
Admission: RE | Admit: 2019-08-24 | Discharge: 2019-08-24 | Disposition: A | Discharge status: Home / Self Care | Payer: Medicare (Managed Care) | Source: Ambulatory Visit | Attending: Ophthalmology | Admitting: Ophthalmology

## 2019-08-24 DIAGNOSIS — Z20822 Contact with and (suspected) exposure to covid-19: Secondary | ICD-10-CM | POA: Insufficient documentation

## 2019-08-24 DIAGNOSIS — Z01812 Encounter for preprocedural laboratory examination: Secondary | ICD-10-CM | POA: Diagnosis present

## 2019-08-24 LAB — SARS CORONAVIRUS 2 (TAT 6-24 HRS): SARS Coronavirus 2: NEGATIVE

## 2019-08-24 NOTE — Discharge Instructions (Signed)

## 2019-08-26 ENCOUNTER — Encounter
Admission: RE | Disposition: A | Discharge status: Home / Self Care | Payer: Self-pay | Source: Home / Self Care | Attending: Ophthalmology

## 2019-08-26 ENCOUNTER — Ambulatory Visit: Payer: Medicare (Managed Care) | Admitting: Anesthesiology

## 2019-08-26 ENCOUNTER — Other Ambulatory Visit: Payer: Self-pay

## 2019-08-26 ENCOUNTER — Encounter: Payer: Self-pay | Admitting: Ophthalmology

## 2019-08-26 ENCOUNTER — Ambulatory Visit
Admission: RE | Admit: 2019-08-26 | Discharge: 2019-08-26 | Disposition: A | Discharge status: Home / Self Care | Payer: Medicare (Managed Care) | Attending: Ophthalmology | Admitting: Ophthalmology

## 2019-08-26 DIAGNOSIS — Z794 Long term (current) use of insulin: Secondary | ICD-10-CM | POA: Insufficient documentation

## 2019-08-26 DIAGNOSIS — Z79899 Other long term (current) drug therapy: Secondary | ICD-10-CM | POA: Insufficient documentation

## 2019-08-26 DIAGNOSIS — H2512 Age-related nuclear cataract, left eye: Secondary | ICD-10-CM | POA: Insufficient documentation

## 2019-08-26 DIAGNOSIS — F329 Major depressive disorder, single episode, unspecified: Secondary | ICD-10-CM | POA: Diagnosis not present

## 2019-08-26 DIAGNOSIS — I10 Essential (primary) hypertension: Secondary | ICD-10-CM | POA: Insufficient documentation

## 2019-08-26 DIAGNOSIS — E039 Hypothyroidism, unspecified: Secondary | ICD-10-CM | POA: Diagnosis not present

## 2019-08-26 DIAGNOSIS — Z87891 Personal history of nicotine dependence: Secondary | ICD-10-CM | POA: Diagnosis not present

## 2019-08-26 DIAGNOSIS — D649 Anemia, unspecified: Secondary | ICD-10-CM | POA: Diagnosis not present

## 2019-08-26 DIAGNOSIS — M1990 Unspecified osteoarthritis, unspecified site: Secondary | ICD-10-CM | POA: Insufficient documentation

## 2019-08-26 DIAGNOSIS — K219 Gastro-esophageal reflux disease without esophagitis: Secondary | ICD-10-CM | POA: Diagnosis not present

## 2019-08-26 DIAGNOSIS — J45909 Unspecified asthma, uncomplicated: Secondary | ICD-10-CM | POA: Diagnosis not present

## 2019-08-26 DIAGNOSIS — Z6841 Body Mass Index (BMI) 40.0 and over, adult: Secondary | ICD-10-CM | POA: Insufficient documentation

## 2019-08-26 DIAGNOSIS — E1136 Type 2 diabetes mellitus with diabetic cataract: Secondary | ICD-10-CM | POA: Insufficient documentation

## 2019-08-26 DIAGNOSIS — Z79890 Hormone replacement therapy (postmenopausal): Secondary | ICD-10-CM | POA: Diagnosis not present

## 2019-08-26 DIAGNOSIS — E1140 Type 2 diabetes mellitus with diabetic neuropathy, unspecified: Secondary | ICD-10-CM | POA: Insufficient documentation

## 2019-08-26 HISTORY — DX: Vitamin B12 deficiency anemia due to intrinsic factor deficiency: D51.0

## 2019-08-26 HISTORY — PX: CATARACT EXTRACTION W/PHACO: SHX586

## 2019-08-26 HISTORY — DX: Other specified symptoms and signs involving the circulatory and respiratory systems: R09.89

## 2019-08-26 HISTORY — DX: Motion sickness, initial encounter: T75.3XXA

## 2019-08-26 LAB — GLUCOSE, CAPILLARY
Glucose-Capillary: 189 mg/dL — ABNORMAL HIGH (ref 70–99)
Glucose-Capillary: 201 mg/dL — ABNORMAL HIGH (ref 70–99)

## 2019-08-26 SURGERY — PHACOEMULSIFICATION, CATARACT, WITH IOL INSERTION
Anesthesia: Monitor Anesthesia Care | Site: Eye | Laterality: Left

## 2019-08-26 MED ORDER — ACETAMINOPHEN 160 MG/5ML PO SOLN: 325.0000 mg | Freq: Once | ORAL | Status: DC

## 2019-08-26 MED ORDER — BRIMONIDINE TARTRATE-TIMOLOL 0.2-0.5 % OP SOLN
OPHTHALMIC | Status: DC | PRN
  Administered 2019-08-26: 1 [drp] via OPHTHALMIC

## 2019-08-26 MED ORDER — MOXIFLOXACIN HCL 0.5 % OP SOLN
OPHTHALMIC | Status: DC | PRN
  Administered 2019-08-26: 0.2 mL via OPHTHALMIC

## 2019-08-26 MED ORDER — NA CHONDROIT SULF-NA HYALURON 40-17 MG/ML IO SOLN
INTRAOCULAR | Status: DC | PRN
  Administered 2019-08-26: 1 mL via INTRAOCULAR

## 2019-08-26 MED ORDER — ARMC OPHTHALMIC DILATING DROPS
1.0000 "application " | OPHTHALMIC | Status: DC | PRN
  Administered 2019-08-26 (×3): 1 via OPHTHALMIC

## 2019-08-26 MED ORDER — FENTANYL CITRATE (PF) 100 MCG/2ML IJ SOLN
INTRAMUSCULAR | Status: DC | PRN
  Administered 2019-08-26 (×2): 50 ug via INTRAVENOUS

## 2019-08-26 MED ORDER — TRYPAN BLUE 0.06 % OP SOLN
OPHTHALMIC | Status: DC | PRN
  Administered 2019-08-26: 0.5 mL via INTRAOCULAR

## 2019-08-26 MED ORDER — LACTATED RINGERS IV SOLN: 100.0000 mL/h | INTRAVENOUS | Status: DC

## 2019-08-26 MED ORDER — TETRACAINE 0.5 % OP SOLN OPTIME - NO CHARGE
OPHTHALMIC | Status: DC | PRN
  Administered 2019-08-26: 08:00:00 2 [drp] via OPHTHALMIC

## 2019-08-26 MED ORDER — TETRACAINE HCL 0.5 % OP SOLN
1.0000 [drp] | OPHTHALMIC | Status: DC | PRN
  Administered 2019-08-26 (×3): 1 [drp] via OPHTHALMIC

## 2019-08-26 MED ORDER — PROVISC 10 MG/ML IO SOLN
INTRAOCULAR | Status: DC | PRN
  Administered 2019-08-26: 0.55 mL via INTRAOCULAR

## 2019-08-26 MED ORDER — ACETAMINOPHEN 325 MG PO TABS: 325.0000 mg | ORAL_TABLET | Freq: Once | ORAL | Status: DC

## 2019-08-26 MED ORDER — MIDAZOLAM HCL 2 MG/2ML IJ SOLN
INTRAMUSCULAR | Status: DC | PRN
  Administered 2019-08-26: 2 mg via INTRAVENOUS

## 2019-08-26 MED ORDER — LIDOCAINE HCL (PF) 2 % IJ SOLN
INTRAOCULAR | Status: DC | PRN
  Administered 2019-08-26: 2 mL via INTRAOCULAR

## 2019-08-26 MED ORDER — EPINEPHRINE PF 1 MG/ML IJ SOLN
INTRAOCULAR | Status: DC | PRN
  Administered 2019-08-26: 80 mL via OPHTHALMIC

## 2019-08-26 SURGICAL SUPPLY — 17 items
DISSECTOR HYDRO NUCLEUS 50X22 (MISCELLANEOUS) ×12 IMPLANT
DRSG TEGADERM 2-3/8X2-3/4 SM (GAUZE/BANDAGES/DRESSINGS) ×3 IMPLANT
DRSG TEGADERM 4X4.75 (GAUZE/BANDAGES/DRESSINGS) ×2 IMPLANT
GLOVE BIOGEL PI IND STRL 8 (GLOVE) ×1 IMPLANT
GLOVE BIOGEL PI INDICATOR 8 (GLOVE) ×2
GOWN STRL REUS W/ TWL LRG LVL3 (GOWN DISPOSABLE) ×1 IMPLANT
GOWN STRL REUS W/ TWL XL LVL3 (GOWN DISPOSABLE) ×1 IMPLANT
GOWN STRL REUS W/TWL LRG LVL3 (GOWN DISPOSABLE) ×3
GOWN STRL REUS W/TWL XL LVL3 (GOWN DISPOSABLE) ×3
KNIFE 45D UP 2.3 (MISCELLANEOUS) ×3 IMPLANT
LENS IOL TECNIS ITEC 14.0 (Intraocular Lens) ×2 IMPLANT
PACK CATARACT (MISCELLANEOUS) ×3 IMPLANT
PACK DR. KING ARMS (PACKS) ×3 IMPLANT
PACK EYE AFTER SURG (MISCELLANEOUS) ×3 IMPLANT
SOLUTION OPHTHALMIC SALT (MISCELLANEOUS) ×3 IMPLANT
WATER STERILE IRR 250ML POUR (IV SOLUTION) ×3 IMPLANT
WIPE NON LINTING 3.25X3.25 (MISCELLANEOUS) ×3 IMPLANT

## 2019-08-26 NOTE — H&P (Signed)
   I have reviewed the patient's H&P and agree with its findings. There have been no interval changes.  Tyriana Helmkamp MD Ophthalmology 

## 2019-08-26 NOTE — Anesthesia Procedure Notes (Signed)
Procedure Name: MAC Performed by: Ifeoma Vallin M, CRNA Pre-anesthesia Checklist: Timeout performed, Patient being monitored, Suction available, Emergency Drugs available and Patient identified Patient Re-evaluated:Patient Re-evaluated prior to induction Oxygen Delivery Method: Nasal cannula       

## 2019-08-26 NOTE — Anesthesia Postprocedure Evaluation (Signed)
Anesthesia Post Note  Patient: Tina Blankenship  Procedure(s) Performed: CATARACT EXTRACTION PHACO AND INTRAOCULAR LENS PLACEMENT (IOC) left VISION BLUE DIABETIC 6.62 00:41.8 (Left Eye)     Patient location during evaluation: PACU Anesthesia Type: MAC Level of consciousness: awake and alert and oriented Pain management: satisfactory to patient Vital Signs Assessment: post-procedure vital signs reviewed and stable Respiratory status: spontaneous breathing, nonlabored ventilation and respiratory function stable Cardiovascular status: blood pressure returned to baseline and stable Postop Assessment: Adequate PO intake and No signs of nausea or vomiting Anesthetic complications: no    Raliegh Ip

## 2019-08-26 NOTE — Op Note (Signed)
  PREOPERATIVE DIAGNOSIS:  Nuclear sclerotic cataract of the LEFT eye.   POSTOPERATIVE DIAGNOSIS:  Nuclear sclerotic cataract of the LEFT eye.   OPERATIVE PROCEDURE: Cataract surgery OS   SURGEON:  Elliot Cousin, MD.   ANESTHESIA:  Anesthesiologist: Ranee Gosselin, MD CRNA: Omer Jack, CRNA  1.      Managed anesthesia care. 2.     0.31ml of Shugarcaine was instilled following the paracentesis   COMPLICATIONS:  None.   TECHNIQUE:   Divide and conquer   DESCRIPTION OF PROCEDURE:  The patient was examined and consented in the preoperative holding area where the aforementioned topical anesthesia was applied to the LEFT eye and then brought back to the Operating Room where the left eye was prepped and draped in the usual sterile ophthalmic fashion and a lid speculum was placed. A paracentesis was created with the side port blade, the anterior chamber was washed out with trypan blue to stain the anterior capsule, and the anterior chamber was filled with viscoelastic. A near clear corneal incision was performed with the steel keratome. A continuous curvilinear capsulorrhexis was performed with a cystotome followed by the capsulorrhexis forceps. Hydrodissection and hydrodelineation were carried out with BSS on a blunt cannula. The lens was removed in a divide and conquer  technique and the remaining cortical material was removed with the irrigation-aspiration handpiece. The capsular bag was inflated with viscoelastic and the lens was placed in the capsular bag without complication. The remaining viscoelastic was removed from the eye with the irrigation-aspiration handpiece. The wounds were hydrated. The anterior chamber was flushed and the eye was inflated to physiologic pressure. 0.52ml Vigamox was placed in the anterior chamber. The wounds were found to be water tight. The eye was dressed with Vigamox. The patient was given protective glasses to wear throughout the day and a shield with which to  sleep tonight. The patient was also given drops with which to begin a drop regimen today and will follow-up with me in one day. Implant Name Type Inv. Item Serial No. Manufacturer Lot No. LRB No. Used Action  LENS IOL DIOP 14.0 - [id number] Intraocular Lens LENS IOL DIOP 14.0 [id number] AMO  Left 1 Implanted    Procedure(s) with comments: CATARACT EXTRACTION PHACO AND INTRAOCULAR LENS PLACEMENT (IOC) left VISION BLUE DIABETIC 6.62 00:41.8 (Left) - Diabetic - Insulin  Electronically signed: Raegen Tarpley 08/26/2019 8:15 AM

## 2019-08-26 NOTE — Anesthesia Preprocedure Evaluation (Signed)
Anesthesia Evaluation  Patient identified by MRN, date of birth, ID band Patient awake    Reviewed: Allergy & Precautions, H&P , NPO status , Patient's Chart, lab work & pertinent test results  Airway Mallampati: III  TM Distance: >3 FB Neck ROM: full    Dental no notable dental hx.    Pulmonary former smoker,    Pulmonary exam normal breath sounds clear to auscultation       Cardiovascular hypertension, Normal cardiovascular exam Rhythm:regular Rate:Normal     Neuro/Psych Depression    GI/Hepatic GERD  ,  Endo/Other  diabetes, Type 2Hypothyroidism Morbid obesity  Renal/GU Renal disease     Musculoskeletal   Abdominal   Peds  Hematology   Anesthesia Other Findings   Reproductive/Obstetrics                             Anesthesia Physical Anesthesia Plan  ASA: III  Anesthesia Plan: MAC   Post-op Pain Management:    Induction:   PONV Risk Score and Plan: 2 and Treatment may vary due to age or medical condition, Midazolam and TIVA  Airway Management Planned:   Additional Equipment:   Intra-op Plan:   Post-operative Plan:   Informed Consent: I have reviewed the patients History and Physical, chart, labs and discussed the procedure including the risks, benefits and alternatives for the proposed anesthesia with the patient or authorized representative who has indicated his/her understanding and acceptance.     Dental Advisory Given  Plan Discussed with: CRNA  Anesthesia Plan Comments:         Anesthesia Quick Evaluation

## 2019-08-26 NOTE — Transfer of Care (Signed)
Immediate Anesthesia Transfer of Care Note  Patient: Tina Blankenship  Procedure(s) Performed: CATARACT EXTRACTION PHACO AND INTRAOCULAR LENS PLACEMENT (IOC) left VISION BLUE DIABETIC 6.62 00:41.8 (Left Eye)  Patient Location: PACU  Anesthesia Type: MAC  Level of Consciousness: awake, alert  and patient cooperative  Airway and Oxygen Therapy: Patient Spontanous Breathing and Patient connected to supplemental oxygen  Post-op Assessment: Post-op Vital signs reviewed, Patient's Cardiovascular Status Stable, Respiratory Function Stable, Patent Airway and No signs of Nausea or vomiting  Post-op Vital Signs: Reviewed and stable  Complications: No apparent anesthesia complications

## 2019-08-26 NOTE — Anesthesia Procedure Notes (Signed)
Procedure Name: MAC Performed by: Izetta Dakin, CRNA Pre-anesthesia Checklist: Timeout performed, Patient being monitored, Emergency Drugs available, Suction available and Patient identified Patient Re-evaluated:Patient Re-evaluated prior to induction Oxygen Delivery Method: Nasal cannula

## 2019-08-27 ENCOUNTER — Encounter: Payer: Self-pay | Admitting: *Deleted

## 2019-09-16 ENCOUNTER — Encounter (INDEPENDENT_AMBULATORY_CARE_PROVIDER_SITE_OTHER): Payer: Medicare (Managed Care) | Admitting: Vascular Surgery

## 2019-09-20 ENCOUNTER — Other Ambulatory Visit: Payer: Self-pay

## 2019-09-20 ENCOUNTER — Encounter (INDEPENDENT_AMBULATORY_CARE_PROVIDER_SITE_OTHER): Payer: Self-pay | Admitting: Vascular Surgery

## 2019-09-20 ENCOUNTER — Ambulatory Visit (INDEPENDENT_AMBULATORY_CARE_PROVIDER_SITE_OTHER): Payer: Medicare (Managed Care) | Admitting: Vascular Surgery

## 2019-09-20 ENCOUNTER — Ambulatory Visit (INDEPENDENT_AMBULATORY_CARE_PROVIDER_SITE_OTHER): Payer: Medicare (Managed Care)

## 2019-09-20 VITALS — BP 138/85 | HR 90 | Ht 65.0 in | Wt 289.0 lb

## 2019-09-20 DIAGNOSIS — L97512 Non-pressure chronic ulcer of other part of right foot with fat layer exposed: Secondary | ICD-10-CM

## 2019-09-20 DIAGNOSIS — I739 Peripheral vascular disease, unspecified: Secondary | ICD-10-CM

## 2019-09-20 DIAGNOSIS — I10 Essential (primary) hypertension: Secondary | ICD-10-CM

## 2019-09-20 DIAGNOSIS — E1122 Type 2 diabetes mellitus with diabetic chronic kidney disease: Secondary | ICD-10-CM | POA: Diagnosis not present

## 2019-09-20 DIAGNOSIS — N183 Chronic kidney disease, stage 3 unspecified: Secondary | ICD-10-CM

## 2019-09-20 DIAGNOSIS — I872 Venous insufficiency (chronic) (peripheral): Secondary | ICD-10-CM

## 2019-09-20 NOTE — Progress Notes (Signed)
MRN : [id number]  Tina Blankenship is a 69 y.o. (1950-12-10) female who presents with chief complaint of  Chief Complaint  Patient presents with  . Follow-up    U/S Follow up  .  History of Present Illness:  The patient returns to the office for followup and review of the noninvasive studies. There have been no interval changes in lower extremity symptoms. No interval shortening of the patient's claudication distance or development of rest pain symptoms. No new ulcers or wounds have occurred since the last visit.  There have been no significant changes to the patient's overall health care.  The patient denies amaurosis fugax or recent TIA symptoms. There are no recent neurological changes noted. The patient denies history of DVT, PE or superficial thrombophlebitis. The patient denies recent episodes of angina or shortness of breath.   ABI Rt=1.00 and Lt=0.95   Duplex ultrasound of the right lower extremity shows uniform velocities with triphasic signals throughout.   Current Meds  Medication Sig  . albuterol (PROVENTIL HFA) 108 (90 Base) MCG/ACT inhaler Inhale 1-2 puffs into the lungs every 6 (six) hours as needed for wheezing or shortness of breath.   . cholecalciferol (VITAMIN D3) 25 MCG (1000 UT) tablet Take 1,000 Units by mouth daily.   . DULoxetine (CYMBALTA) 60 MG capsule TAKE 1 CAPSULE (60 MG TOTAL) BY MOUTH 2 (TWO) TIMES DAILY.  Marland Kitchen esomeprazole (NEXIUM) 40 MG capsule Take 40 mg by mouth daily at 12 noon.  . fexofenadine (ALLEGRA) 180 MG tablet Take 180 mg by mouth daily.  Marland Kitchen glucose blood test strip Use as instructed  . HUMALOG MIX 75/25 KWIKPEN (75-25) 100 UNIT/ML Kwikpen Inject 12 Units into the skin 2 (two) times daily. 12- 15 units BID  . insulin glargine (LANTUS) 100 UNIT/ML injection Inject 63 Units into the skin at bedtime.   . Insulin Pen Needle (B-D ULTRAFINE III SHORT PEN) 31G X 8 MM MISC USE 4 TIMES DAILY WITH INSULIN  . lisinopril-hydrochlorothiazide  (PRINZIDE,ZESTORETIC) 20-12.5 MG tablet TAKE 1 TABLET BY MOUTH DAILY.  Marland Kitchen lovastatin (MEVACOR) 40 MG tablet Take 1 tablet (40 mg total) by mouth daily.  . pregabalin (LYRICA) 100 MG capsule Take 100 mg by mouth 3 (three) times daily.   . Semaglutide (OZEMPIC, 1 MG/DOSE, Pesotum) Inject into the skin once a week. Mondays  . SYNTHROID 50 MCG tablet TAKE 1 TABLET (50 MCG TOTAL) BY MOUTH DAILY. (Patient taking differently: Take 50 mcg by mouth daily before breakfast. )    Past Medical History:  Diagnosis Date  . Abscess of back 2004   lumbar spine abscess, hospitalized about 1 month  . Anemia   . Arthritis    knees, joints  . Asthma    last attack year ago, allergy related  . Car sickness   . Chicken pox   . 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 Depression   . Diabetes mellitus    type 2  . Diabetic neuropathy (HCC)   . GERD (gastroesophageal reflux disease)   . Heart murmur   . History of hiatal hernia   . History of kidney stones   . Hyperlipidemia   . Hypertension   . Hypothyroidism   . Migraines    occasional  . Multiple gastric ulcers   . Neuromuscular disorder (HCC)    neuropathies  . Neuropathy    feet and hands  . Obesity   . Pernicious anemia   . Runny  nose    cough/ probably allergies per patient  . Urine incontinence   . Vertigo    occasional    Past Surgical History:  Procedure Laterality Date  . CATARACT EXTRACTION W/PHACO Left 08/26/2019   Procedure: CATARACT EXTRACTION PHACO AND INTRAOCULAR LENS PLACEMENT (IOC) left VISION BLUE DIABETIC 6.62 00:41.8;  Surgeon: Marchia Meiers, MD;  Location: [facility];  Service: Ophthalmology;  Laterality: Left;  Diabetic - Insulin  . FOOT SURGERY Right    multiple  . TUBAL LIGATION      Social History Social History   Tobacco Use  . Smoking status: Former Smoker    Packs/day: 0.50    Years: 10.00    Pack years: 5.00    Types: Cigarettes  . Smokeless tobacco: Never Used  .  Tobacco comment: quit 20 years ago  Substance Use Topics  . Alcohol use: No    Alcohol/week: 0.0 standard drinks  . Drug use: No    Family History Family History  Problem Relation Age of Onset  . Arthritis Mother   . Heart disease Mother   . Alcohol abuse Father   . Diabetes Father   . Sudden death Brother   . Arthritis Maternal Grandmother   . Heart disease Maternal Grandmother   . Hypertension Maternal Grandmother   . Pancreatic cancer Maternal Grandmother   . Arthritis Maternal Grandfather   . Prostate cancer Maternal Grandfather   . Hyperlipidemia Maternal Grandfather   . Diabetes Paternal Grandmother   . Stroke Paternal Grandfather   . Breast cancer Neg Hx     Allergies  Allergen Reactions  . Kiwi Fragrance [Kiwi Extract] Swelling    Swelling of feet and legs  . Wasp Venom Swelling    Bees Swelling of feet and legs     REVIEW OF SYSTEMS (Negative unless checked)  Constitutional: [] Weight loss  [] Fever  [] Chills Cardiac: [] Chest pain   [] Chest pressure   [] Palpitations   [] Shortness of breath when laying flat   [] Shortness of breath with exertion. Vascular:  [] Pain in legs with walking   [] Pain in legs at rest  [] History of DVT   [] Phlebitis   [] Swelling in legs   [] Varicose veins   [x] Ulcers Pulmonary:   [] Uses home oxygen   [] Productive cough   [] Hemoptysis   [] Wheeze  [] COPD   [] Asthma Neurologic:  [] Dizziness   [] Seizures   [] History of stroke   [] History of TIA  [] Aphasia   [] Vissual changes   [] Weakness or numbness in arm   [] Weakness or numbness in leg Musculoskeletal:   [] Joint swelling   [] Joint pain   [] Low back pain Hematologic:  [] Easy bruising  [] Easy bleeding   [] Hypercoagulable state   [] Anemic Gastrointestinal:  [] Diarrhea   [] Vomiting  [] Gastroesophageal reflux/heartburn   [] Difficulty swallowing. Genitourinary:  [] Chronic kidney disease   [] Difficult urination  [] Frequent urination   [] Blood in urine Skin:  [] Rashes   [x] Ulcers  Psychological:   [] History of anxiety   []  History of major depression.  Physical Examination  Vitals:   09/20/19 1430  BP: 138/85  Pulse: 90  Weight: 289 lb (131.1 kg)  Height: 5\' 5"  (1.651 m)   Body mass index is 48.09 kg/m. Gen: WD/WN, NAD Head: [place]/AT, No temporalis wasting.  Ear/Nose/Throat: Hearing grossly intact, nares w/o erythema or drainage Eyes: PER, EOMI, sclera nonicteric.  Neck: Supple, no large masses.   Pulmonary:  Good air movement, no audible wheezing bilaterally, no use of accessory muscles.  Cardiac: RRR, no  JVD Vascular: right heel dressed Vessel Right Left  Radial Palpable Palpable  PT Palpable Trace Palpable  DP Palpable 1+ Palpable  Gastrointestinal: Non-distended. No guarding/no peritoneal signs.  Musculoskeletal: M/S 5/5 throughout.  No deformity or atrophy.  Neurologic: CN 2-12 intact. Symmetrical.  Speech is fluent. Motor exam as listed above. Psychiatric: Judgment intact, Mood & affect appropriate for pt's clinical situation. Dermatologic: No rashes or ulcers noted.  No changes consistent with cellulitis.   CBC Lab Results  Component Value Date   WBC 10.2 09/30/2016   HGB 11.7 (L) 09/30/2016   HCT 35.7 (L) 09/30/2016   MCV 85.4 09/30/2016   PLT 325.0 09/30/2016    BMET    Component Value Date/Time   NA 137 09/30/2016 1045   K 4.4 09/30/2016 1045   CL 101 09/30/2016 1045   CO2 27 09/30/2016 1045   GLUCOSE 50 (L) 09/30/2016 1045   BUN 25 (H) 09/30/2016 1045   CREATININE 1.23 (H) 09/30/2016 1045   CALCIUM 10.2 09/30/2016 1045   GFRNONAA 40 (L) 11/04/2011 1000   GFRAA 47 (L) 11/04/2011 1000   CrCl cannot be calculated (Patient's most recent lab result is older than the maximum 21 days allowed.).  COAG Lab Results  Component Value Date   INR 1.2 01/07/2009    Radiology No results found.    Assessment/Plan 1. PAD (peripheral artery disease) (HCC) Recommend:  I do not find evidence of life style limiting vascular disease. The patient  specifically denies life style limitation.  Previous noninvasive studies including ABI's of the legs do not identify critical vascular problems.  The patient should continue walking and begin a more formal exercise program. The patient should continue his antiplatelet therapy and aggressive treatment of the lipid abnormalities.  The patient should begin wearing graduated compression socks 15-20 mmHg strength to control her mild edema.  Patient will follow-up with me on a PRN basis   2. Chronic venous insufficiency I have had a long discussion with the patient regarding swelling and why it  causes symptoms.  Patient will begin wearing graduated compression stockings on a daily basis a prescription was given. The patient will  beginning wearing the stockings first thing in the morning and removing them in the evening. The patient is instructed specifically not to sleep in the stockings.   In addition, behavioral modification will be initiated.  This will include frequent elevation, use of over the counter pain medications and exercise such as walking.  I have reviewed systemic causes for chronic edema such as liver, kidney and cardiac etiologies.  The patient denies problems with these organ systems.     3. Benign essential hypertension Continue antihypertensive medications as already ordered, these medications have been reviewed and there are no changes at this time.   4. Type 2 diabetes mellitus with stage 3 chronic kidney disease, unspecified whether long term insulin use, unspecified whether stage 3a or 3b CKD (HCC) Continue hypoglycemic medications as already ordered, these medications have been reviewed and there are no changes at this time.  Hgb A1C to be monitored as already arranged by primary service     Levora Dredge, MD  09/20/2019 3:30 PM

## 2019-09-21 NOTE — Anesthesia Preprocedure Evaluation (Addendum)
Anesthesia Evaluation  Patient identified by MRN, date of birth, ID band Patient awake    Reviewed: Allergy & Precautions, NPO status , Patient's Chart, lab work & pertinent test results, reviewed documented beta blocker date and time   History of Anesthesia Complications Negative for: history of anesthetic complications  Airway Mallampati: III  TM Distance: >3 FB Neck ROM: Full    Dental  (+) Partial Lower, Partial Upper   Pulmonary asthma , former smoker,    breath sounds clear to auscultation       Cardiovascular hypertension, (-) angina+ Peripheral Vascular Disease  (-) DOE  Rhythm:Regular Rate:Normal   HLD   Neuro/Psych  Headaches (Migraines), PSYCHIATRIC DISORDERS Depression  Neuromuscular disease (Peripheral neuropathy)    GI/Hepatic hiatal hernia, PUD, GERD  Controlled,  Endo/Other  diabetesHypothyroidism Morbid obesity (BMI 48)  Renal/GU CRFRenal disease (Stones)     Musculoskeletal  (+) Arthritis ,   Abdominal   Peds  Hematology  (+) anemia ,   Anesthesia Other Findings   Reproductive/Obstetrics                            Anesthesia Physical Anesthesia Plan  ASA: III  Anesthesia Plan: MAC   Post-op Pain Management:    Induction: Intravenous  PONV Risk Score and Plan: 2 and TIVA, Midazolam and Treatment may vary due to age or medical condition  Airway Management Planned: Nasal Cannula  Additional Equipment:   Intra-op Plan:   Post-operative Plan:   Informed Consent: I have reviewed the patients History and Physical, chart, labs and discussed the procedure including the risks, benefits and alternatives for the proposed anesthesia with the patient or authorized representative who has indicated his/her understanding and acceptance.       Plan Discussed with: CRNA and Anesthesiologist  Anesthesia Plan Comments:         Anesthesia Quick Evaluation

## 2019-09-23 ENCOUNTER — Other Ambulatory Visit: Payer: Self-pay

## 2019-09-23 ENCOUNTER — Encounter: Payer: Self-pay | Admitting: Ophthalmology

## 2019-09-23 NOTE — Discharge Instructions (Signed)

## 2019-09-24 ENCOUNTER — Other Ambulatory Visit
Admission: RE | Admit: 2019-09-24 | Discharge: 2019-09-24 | Disposition: A | Discharge status: Home / Self Care | Payer: Medicare (Managed Care) | Source: Ambulatory Visit | Attending: Ophthalmology | Admitting: Ophthalmology

## 2019-09-24 DIAGNOSIS — Z01812 Encounter for preprocedural laboratory examination: Secondary | ICD-10-CM | POA: Diagnosis present

## 2019-09-24 DIAGNOSIS — Z20822 Contact with and (suspected) exposure to covid-19: Secondary | ICD-10-CM | POA: Insufficient documentation

## 2019-09-25 LAB — SARS CORONAVIRUS 2 (TAT 6-24 HRS): SARS Coronavirus 2: NEGATIVE

## 2019-09-29 ENCOUNTER — Other Ambulatory Visit: Payer: Self-pay

## 2019-09-29 ENCOUNTER — Encounter
Admission: RE | Disposition: A | Discharge status: Home / Self Care | Payer: Self-pay | Source: Home / Self Care | Attending: Ophthalmology

## 2019-09-29 ENCOUNTER — Ambulatory Visit: Payer: Medicare (Managed Care) | Admitting: Anesthesiology

## 2019-09-29 ENCOUNTER — Ambulatory Visit
Admission: RE | Admit: 2019-09-29 | Discharge: 2019-09-29 | Disposition: A | Discharge status: Home / Self Care | Payer: Medicare (Managed Care) | Attending: Ophthalmology | Admitting: Ophthalmology

## 2019-09-29 ENCOUNTER — Encounter: Payer: Self-pay | Admitting: Ophthalmology

## 2019-09-29 DIAGNOSIS — H2511 Age-related nuclear cataract, right eye: Secondary | ICD-10-CM | POA: Diagnosis present

## 2019-09-29 DIAGNOSIS — E1140 Type 2 diabetes mellitus with diabetic neuropathy, unspecified: Secondary | ICD-10-CM | POA: Diagnosis not present

## 2019-09-29 DIAGNOSIS — M1990 Unspecified osteoarthritis, unspecified site: Secondary | ICD-10-CM | POA: Insufficient documentation

## 2019-09-29 DIAGNOSIS — Z79899 Other long term (current) drug therapy: Secondary | ICD-10-CM | POA: Diagnosis not present

## 2019-09-29 DIAGNOSIS — Z87891 Personal history of nicotine dependence: Secondary | ICD-10-CM | POA: Diagnosis not present

## 2019-09-29 DIAGNOSIS — J45909 Unspecified asthma, uncomplicated: Secondary | ICD-10-CM | POA: Insufficient documentation

## 2019-09-29 DIAGNOSIS — E1136 Type 2 diabetes mellitus with diabetic cataract: Secondary | ICD-10-CM | POA: Insufficient documentation

## 2019-09-29 DIAGNOSIS — E1151 Type 2 diabetes mellitus with diabetic peripheral angiopathy without gangrene: Secondary | ICD-10-CM | POA: Insufficient documentation

## 2019-09-29 DIAGNOSIS — E669 Obesity, unspecified: Secondary | ICD-10-CM | POA: Diagnosis not present

## 2019-09-29 DIAGNOSIS — Z6841 Body Mass Index (BMI) 40.0 and over, adult: Secondary | ICD-10-CM | POA: Insufficient documentation

## 2019-09-29 DIAGNOSIS — K219 Gastro-esophageal reflux disease without esophagitis: Secondary | ICD-10-CM | POA: Insufficient documentation

## 2019-09-29 DIAGNOSIS — I10 Essential (primary) hypertension: Secondary | ICD-10-CM | POA: Diagnosis not present

## 2019-09-29 DIAGNOSIS — Z79890 Hormone replacement therapy (postmenopausal): Secondary | ICD-10-CM | POA: Insufficient documentation

## 2019-09-29 DIAGNOSIS — Z794 Long term (current) use of insulin: Secondary | ICD-10-CM | POA: Diagnosis not present

## 2019-09-29 HISTORY — PX: CATARACT EXTRACTION W/PHACO: SHX586

## 2019-09-29 LAB — GLUCOSE, CAPILLARY
Glucose-Capillary: 143 mg/dL — ABNORMAL HIGH (ref 70–99)
Glucose-Capillary: 177 mg/dL — ABNORMAL HIGH (ref 70–99)

## 2019-09-29 SURGERY — PHACOEMULSIFICATION, CATARACT, WITH IOL INSERTION
Anesthesia: Monitor Anesthesia Care | Site: Eye | Laterality: Right

## 2019-09-29 MED ORDER — LIDOCAINE HCL (PF) 2 % IJ SOLN
INTRAOCULAR | Status: DC | PRN
  Administered 2019-09-29: 2 mL via INTRAOCULAR

## 2019-09-29 MED ORDER — NA CHONDROIT SULF-NA HYALURON 40-17 MG/ML IO SOLN
INTRAOCULAR | Status: DC | PRN
  Administered 2019-09-29: 1 mL via INTRAOCULAR

## 2019-09-29 MED ORDER — TRYPAN BLUE 0.06 % OP SOLN
OPHTHALMIC | Status: DC | PRN
  Administered 2019-09-29: 0.5 mL via INTRAOCULAR

## 2019-09-29 MED ORDER — FENTANYL CITRATE (PF) 100 MCG/2ML IJ SOLN
INTRAMUSCULAR | Status: DC | PRN
  Administered 2019-09-29: 50 ug via INTRAVENOUS

## 2019-09-29 MED ORDER — TETRACAINE 0.5 % OP SOLN OPTIME - NO CHARGE
OPHTHALMIC | Status: DC | PRN
  Administered 2019-09-29: 2 [drp] via OPHTHALMIC

## 2019-09-29 MED ORDER — PROVISC 10 MG/ML IO SOLN
INTRAOCULAR | Status: DC | PRN
  Administered 2019-09-29: 0.55 mL via INTRAOCULAR

## 2019-09-29 MED ORDER — ARMC OPHTHALMIC DILATING DROPS
1.0000 "application " | OPHTHALMIC | Status: DC | PRN
  Administered 2019-09-29 (×3): 1 via OPHTHALMIC

## 2019-09-29 MED ORDER — BRIMONIDINE TARTRATE-TIMOLOL 0.2-0.5 % OP SOLN
OPHTHALMIC | Status: DC | PRN
  Administered 2019-09-29: 1 [drp] via OPHTHALMIC

## 2019-09-29 MED ORDER — ACETAMINOPHEN 10 MG/ML IV SOLN: 1000.0000 mg | Freq: Once | INTRAVENOUS | Status: DC | PRN

## 2019-09-29 MED ORDER — EPINEPHRINE PF 1 MG/ML IJ SOLN
INTRAOCULAR | Status: DC | PRN
  Administered 2019-09-29: 64 mL via OPHTHALMIC

## 2019-09-29 MED ORDER — TETRACAINE HCL 0.5 % OP SOLN
1.0000 [drp] | OPHTHALMIC | Status: DC | PRN
  Administered 2019-09-29 (×3): 1 [drp] via OPHTHALMIC

## 2019-09-29 MED ORDER — MOXIFLOXACIN HCL 0.5 % OP SOLN
OPHTHALMIC | Status: DC | PRN
  Administered 2019-09-29: 0.2 mL via OPHTHALMIC

## 2019-09-29 MED ORDER — ONDANSETRON HCL 4 MG/2ML IJ SOLN: 4.0000 mg | Freq: Once | INTRAMUSCULAR | Status: DC | PRN

## 2019-09-29 SURGICAL SUPPLY — 18 items
DISSECTOR HYDRO NUCLEUS 50X22 (MISCELLANEOUS) ×12 IMPLANT
DRSG TEGADERM 2-3/8X2-3/4 SM (GAUZE/BANDAGES/DRESSINGS) ×3 IMPLANT
GLOVE BIOGEL PI IND STRL 8 (GLOVE) ×1 IMPLANT
GLOVE BIOGEL PI INDICATOR 8 (GLOVE) ×2
GOWN STRL REUS W/ TWL LRG LVL3 (GOWN DISPOSABLE) ×1 IMPLANT
GOWN STRL REUS W/ TWL XL LVL3 (GOWN DISPOSABLE) ×1 IMPLANT
GOWN STRL REUS W/TWL LRG LVL3 (GOWN DISPOSABLE) ×3
GOWN STRL REUS W/TWL XL LVL3 (GOWN DISPOSABLE) ×3
KNIFE 45D UP 2.3 (MISCELLANEOUS) ×3 IMPLANT
LENS IOL DIOP 15.5 (Intraocular Lens) ×3 IMPLANT
LENS IOL TECNIS MONO 15.5 (Intraocular Lens) IMPLANT
MARKER SKIN DUAL TIP RULER LAB (MISCELLANEOUS) ×3 IMPLANT
PACK CATARACT (MISCELLANEOUS) ×3 IMPLANT
PACK DR. KING ARMS (PACKS) ×3 IMPLANT
PACK EYE AFTER SURG (MISCELLANEOUS) ×3 IMPLANT
SOLUTION OPHTHALMIC SALT (MISCELLANEOUS) ×3 IMPLANT
WATER STERILE IRR 250ML POUR (IV SOLUTION) ×3 IMPLANT
WIPE NON LINTING 3.25X3.25 (MISCELLANEOUS) ×3 IMPLANT

## 2019-09-29 NOTE — Anesthesia Procedure Notes (Signed)
Procedure Name: MAC Date/Time: 09/29/2019 7:43 AM Performed by: Jeannene Patella, CRNA Pre-anesthesia Checklist: Patient identified, Emergency Drugs available, Suction available, Patient being monitored and Timeout performed Patient Re-evaluated:Patient Re-evaluated prior to induction Oxygen Delivery Method: Nasal cannula

## 2019-09-29 NOTE — Anesthesia Postprocedure Evaluation (Signed)
Anesthesia Post Note  Patient: Tina Blankenship  Procedure(s) Performed: CATARACT EXTRACTION PHACO AND INTRAOCULAR LENS PLACEMENT (IOC) RIGHT DIABETIC VISION BLUE 3.18 00:27.9 (Right Eye)     Patient location during evaluation: PACU Anesthesia Type: MAC Level of consciousness: awake and alert Pain management: pain level controlled Vital Signs Assessment: post-procedure vital signs reviewed and stable Respiratory status: spontaneous breathing, nonlabored ventilation, respiratory function stable and patient connected to nasal cannula oxygen Cardiovascular status: stable and blood pressure returned to baseline Postop Assessment: no apparent nausea or vomiting Anesthetic complications: no    Walther Sanagustin A  Gabby Rackers

## 2019-09-29 NOTE — Transfer of Care (Signed)
Immediate Anesthesia Transfer of Care Note  Patient: Tina Blankenship  Procedure(s) Performed: CATARACT EXTRACTION PHACO AND INTRAOCULAR LENS PLACEMENT (IOC) RIGHT DIABETIC VISION BLUE 3.18 00:27.9 (Right Eye)  Patient Location: PACU  Anesthesia Type: MAC  Level of Consciousness: awake, alert  and patient cooperative  Airway and Oxygen Therapy: Patient Spontanous Breathing and Patient connected to supplemental oxygen  Post-op Assessment: Post-op Vital signs reviewed, Patient's Cardiovascular Status Stable, Respiratory Function Stable, Patent Airway and No signs of Nausea or vomiting  Post-op Vital Signs: Reviewed and stable  Complications: No apparent anesthesia complications

## 2019-09-29 NOTE — Op Note (Signed)
  PREOPERATIVE DIAGNOSIS:  Nuclear sclerotic cataract of the RIGHT eye.   POSTOPERATIVE DIAGNOSIS:  Nuclear sclerotic cataract of the RIGHT eye.   OPERATIVE PROCEDURE: Cataract surgery OD   SURGEON:  Elliot Cousin, MD.   ANESTHESIA:  Anesthesiologist: Heniser, Burman Foster, MD CRNA: Jinny Blossom, CRNA  1.      Managed anesthesia care. 2.     0.7ml of Shugarcaine was instilled following the paracentesis   COMPLICATIONS:  None.   TECHNIQUE:   Divide and conquer   DESCRIPTION OF PROCEDURE:  The patient was examined and consented in the preoperative holding area where the aforementioned topical anesthesia was applied to the RIGHT eye and then brought back to the Operating Room where the RIGHT eye was prepped and draped in the usual sterile ophthalmic fashion and a lid speculum was placed. A paracentesis was created with the side port blade, the anterior chamber was washed out with trypan blue to stain the anterior capsule, and the anterior chamber was filled with viscoelastic. A near clear corneal incision was performed with the steel keratome. A continuous curvilinear capsulorrhexis was performed with a cystotome followed by the capsulorrhexis forceps. Hydrodissection and hydrodelineation were carried out with BSS on a blunt cannula. The lens was removed in a divide and conquer  technique and the remaining cortical material was removed with the irrigation-aspiration handpiece. The capsular bag was inflated with viscoelastic and the lens was placed in the capsular bag without complication. The remaining viscoelastic was removed from the eye with the irrigation-aspiration handpiece. The wounds were hydrated. The anterior chamber was flushed and the eye was inflated to physiologic pressure. 0.87ml Vigamox was placed in the anterior chamber. The wounds were found to be water tight. The eye was dressed with Vigamox. The patient was given protective glasses to wear throughout the day and a shield with  which to sleep tonight. The patient was also given drops with which to begin a drop regimen today and will follow-up with me in one day. Implant Name Type Inv. Item Serial No. Manufacturer Lot No. LRB No. Used Action  LENS IOL DIOP 15.5 - [id number] Intraocular Lens LENS IOL DIOP 15.5 [id number]  Right 1 Implanted    Procedure(s) with comments: CATARACT EXTRACTION PHACO AND INTRAOCULAR LENS PLACEMENT (IOC) RIGHT DIABETIC VISION BLUE 3.18 00:27.9 (Right) - Diabetic - insulin  Electronically signed: Merit Maybee 09/29/2019 8:07 AM

## 2019-09-29 NOTE — H&P (Signed)
   I have reviewed the patient's H&P and agree with its findings. There have been no interval changes.  Kayson Tasker MD Ophthalmology 

## 2019-09-30 ENCOUNTER — Encounter: Payer: Self-pay | Admitting: *Deleted

## 2020-03-01 ENCOUNTER — Other Ambulatory Visit: Payer: Self-pay | Admitting: Family Medicine

## 2020-03-01 DIAGNOSIS — Z1231 Encounter for screening mammogram for malignant neoplasm of breast: Secondary | ICD-10-CM

## 2020-03-27 ENCOUNTER — Other Ambulatory Visit
Admission: RE | Admit: 2020-03-27 | Discharge: 2020-03-27 | Disposition: A | Discharge status: Home / Self Care | Payer: Medicare (Managed Care) | Source: Ambulatory Visit | Attending: Internal Medicine | Admitting: Internal Medicine

## 2020-03-27 ENCOUNTER — Other Ambulatory Visit: Payer: Self-pay

## 2020-03-27 DIAGNOSIS — Z01818 Encounter for other preprocedural examination: Secondary | ICD-10-CM | POA: Insufficient documentation

## 2020-03-27 DIAGNOSIS — Z20822 Contact with and (suspected) exposure to covid-19: Secondary | ICD-10-CM | POA: Insufficient documentation

## 2020-03-27 LAB — SARS CORONAVIRUS 2 (TAT 6-24 HRS): SARS Coronavirus 2: NEGATIVE

## 2020-03-29 ENCOUNTER — Ambulatory Visit: Payer: Medicare (Managed Care) | Admitting: Certified Registered"

## 2020-03-29 ENCOUNTER — Ambulatory Visit
Admission: RE | Admit: 2020-03-29 | Discharge: 2020-03-29 | Disposition: A | Discharge status: Home / Self Care | Payer: Medicare (Managed Care) | Attending: Internal Medicine | Admitting: Internal Medicine

## 2020-03-29 ENCOUNTER — Encounter
Admission: RE | Disposition: A | Discharge status: Home / Self Care | Payer: Self-pay | Source: Home / Self Care | Attending: Internal Medicine

## 2020-03-29 ENCOUNTER — Other Ambulatory Visit: Payer: Self-pay

## 2020-03-29 DIAGNOSIS — K591 Functional diarrhea: Secondary | ICD-10-CM | POA: Insufficient documentation

## 2020-03-29 DIAGNOSIS — Z7951 Long term (current) use of inhaled steroids: Secondary | ICD-10-CM | POA: Insufficient documentation

## 2020-03-29 DIAGNOSIS — Z79890 Hormone replacement therapy (postmenopausal): Secondary | ICD-10-CM | POA: Insufficient documentation

## 2020-03-29 DIAGNOSIS — K640 First degree hemorrhoids: Secondary | ICD-10-CM | POA: Diagnosis not present

## 2020-03-29 DIAGNOSIS — Z794 Long term (current) use of insulin: Secondary | ICD-10-CM | POA: Insufficient documentation

## 2020-03-29 DIAGNOSIS — Z79899 Other long term (current) drug therapy: Secondary | ICD-10-CM | POA: Diagnosis not present

## 2020-03-29 DIAGNOSIS — K219 Gastro-esophageal reflux disease without esophagitis: Secondary | ICD-10-CM | POA: Insufficient documentation

## 2020-03-29 HISTORY — PX: COLONOSCOPY WITH PROPOFOL: SHX5780

## 2020-03-29 LAB — GLUCOSE, CAPILLARY: Glucose-Capillary: 274 mg/dL — ABNORMAL HIGH (ref 70–99)

## 2020-03-29 SURGERY — COLONOSCOPY WITH PROPOFOL
Anesthesia: General

## 2020-03-29 MED ORDER — GLYCOPYRROLATE 0.2 MG/ML IJ SOLN
INTRAMUSCULAR | Status: DC | PRN
  Administered 2020-03-29: .2 mg via INTRAVENOUS

## 2020-03-29 MED ORDER — SODIUM CHLORIDE 0.9 % IV SOLN: INTRAVENOUS | Status: DC

## 2020-03-29 MED ORDER — LIDOCAINE HCL (CARDIAC) PF 100 MG/5ML IV SOSY
PREFILLED_SYRINGE | INTRAVENOUS | Status: DC | PRN
  Administered 2020-03-29: 100 mg via INTRAVENOUS

## 2020-03-29 MED ORDER — PROPOFOL 10 MG/ML IV BOLUS
INTRAVENOUS | Status: DC | PRN
  Administered 2020-03-29: 40 mg via INTRAVENOUS

## 2020-03-29 MED ORDER — PROPOFOL 500 MG/50ML IV EMUL
INTRAVENOUS | Status: DC | PRN
  Administered 2020-03-29: 145 ug/kg/min via INTRAVENOUS

## 2020-03-29 MED ORDER — EPHEDRINE SULFATE 50 MG/ML IJ SOLN
INTRAMUSCULAR | Status: DC | PRN
  Administered 2020-03-29: 5 mg via INTRAVENOUS

## 2020-03-29 NOTE — Anesthesia Preprocedure Evaluation (Signed)
Anesthesia Evaluation  Patient identified by MRN, date of birth, ID band Patient awake    Reviewed: Allergy & Precautions, NPO status , Patient's Chart, lab work & pertinent test results  Airway Mallampati: III       Dental  (+) Partial Upper, Partial Lower   Pulmonary asthma , former smoker,    Pulmonary exam normal breath sounds clear to auscultation       Cardiovascular hypertension, + Peripheral Vascular Disease  Normal cardiovascular exam+ Valvular Problems/Murmurs  Rhythm:Regular Rate:Normal + Systolic murmurs    Neuro/Psych  Headaches, PSYCHIATRIC DISORDERS Depression  Neuromuscular disease    GI/Hepatic Neg liver ROS, hiatal hernia, PUD, GERD  ,  Endo/Other  diabetesHypothyroidism   Renal/GU CRFRenal disease  negative genitourinary   Musculoskeletal negative musculoskeletal ROS (+)   Abdominal   Peds negative pediatric ROS (+)  Hematology negative hematology ROS (+)   Anesthesia Other Findings   Reproductive/Obstetrics negative OB ROS                             Anesthesia Physical Anesthesia Plan  ASA: III  Anesthesia Plan: General   Post-op Pain Management:    Induction: Intravenous  PONV Risk Score and Plan: 3 and Propofol infusion  Airway Management Planned: Nasal Cannula  Additional Equipment:   Intra-op Plan:   Post-operative Plan:   Informed Consent: I have reviewed the patients History and Physical, chart, labs and discussed the procedure including the risks, benefits and alternatives for the proposed anesthesia with the patient or authorized representative who has indicated his/her understanding and acceptance.       Plan Discussed with: CRNA, Anesthesiologist and Surgeon  Anesthesia Plan Comments:         Anesthesia Quick Evaluation

## 2020-03-29 NOTE — Transfer of Care (Signed)
Immediate Anesthesia Transfer of Care Note  Patient: Tina Blankenship  Procedure(s) Performed: COLONOSCOPY WITH PROPOFOL (N/A )  Patient Location: Endoscopy Unit  Anesthesia Type:General  Level of Consciousness: drowsy and responds to stimulation  Airway & Oxygen Therapy: Patient Spontanous Breathing and Patient connected to face mask oxygen  Post-op Assessment: Report given to RN and Post -op Vital signs reviewed and stable  Post vital signs: Reviewed and stable  Last Vitals:  Vitals Value Taken Time  BP 94/49 03/29/20 1508  Temp    Pulse 95 03/29/20 1510  Resp 18 03/29/20 1510  SpO2 100 % 03/29/20 1510  Vitals shown include unvalidated device data.  Last Pain:  Vitals:   03/29/20 1506  TempSrc: (P) Temporal  PainSc:          Complications: No complications documented.

## 2020-03-29 NOTE — Interval H&P Note (Signed)
History and Physical Interval Note:  03/29/2020 2:34 PM  Bud Face  has presented today for surgery, with the diagnosis of CHRONIC DIARRHEA.  The various methods of treatment have been discussed with the patient and family. After consideration of risks, benefits and other options for treatment, the patient has consented to  Procedure(s): COLONOSCOPY WITH PROPOFOL (N/A) as a surgical intervention.  The patient's history has been reviewed, patient examined, no change in status, stable for surgery.  I have reviewed the patient's chart and labs.  Questions were answered to the patient's satisfaction.     [place], [place]

## 2020-03-29 NOTE — Anesthesia Procedure Notes (Signed)
Procedure Name: General with mask airway Performed by: Fletcher-Harrison, Taelor Waymire, CRNA Pre-anesthesia Checklist: Patient identified, Emergency Drugs available, Suction available and Patient being monitored Patient Re-evaluated:Patient Re-evaluated prior to induction Oxygen Delivery Method: Simple face mask Induction Type: IV induction Placement Confirmation: positive ETCO2 and CO2 detector Dental Injury: Teeth and Oropharynx as per pre-operative assessment        

## 2020-03-29 NOTE — H&P (Signed)
Outpatient short stay form Pre-procedure 03/29/2020 2:31 PM Tina Nolet K. Tina Blankenship, M.D.  Primary Physician: Dr. Derek Mound  Reason for visit:  Functional diarrhea  History of present illness:  69 y/o female presents for colonoscopy to investigate acute on chronic diarrhea. No hematochezia or weight loss. Has hx of GERD and abdominal bloating and has begun an increased dose of Nexium with some moderate improvement. No UGI alarm symptoms.    Current Facility-Administered Medications:  .  0.9 %  sodium chloride infusion, , Intravenous, Continuous, [place], Boykin Nearing, MD, Last Rate: 20 mL/hr at 03/29/20 1347, New Bag at 03/29/20 1347  Medications Prior to Admission  Medication Sig Dispense Refill Last Dose  . acetaminophen (TYLENOL) 500 MG tablet Take 500 mg by mouth every 6 (six) hours as needed.   Past Week at Unknown time  . albuterol (PROVENTIL HFA) 108 (90 Base) MCG/ACT inhaler Inhale 1-2 puffs into the lungs every 6 (six) hours as needed for wheezing or shortness of breath.    Past Week at Unknown time  . cholecalciferol (VITAMIN D3) 25 MCG (1000 UT) tablet Take 1,000 Units by mouth daily.    Past Week at Unknown time  . Cyanocobalamin (VITAMIN B-12 IJ) Inject as directed.   Past Week at Unknown time  . cycloSPORINE (RESTASIS) 0.05 % ophthalmic emulsion 1 drop 2 (two) times daily.   Past Week at Unknown time  . DULoxetine (CYMBALTA) 60 MG capsule TAKE 1 CAPSULE (60 MG TOTAL) BY MOUTH 2 (TWO) TIMES DAILY. 180 capsule 1 Past Week at Unknown time  . esomeprazole (NEXIUM) 40 MG capsule Take 40 mg by mouth daily at 12 noon.   Past Week at Unknown time  . fexofenadine (ALLEGRA) 180 MG tablet Take 180 mg by mouth daily.   Past Week at Unknown time  . fluticasone (FLOVENT HFA) 44 MCG/ACT inhaler Inhale 2 puffs into the lungs 2 (two) times daily.   Past Week at Unknown time  . glucose blood test strip Use as instructed 100 each 6 Past Week at Unknown time  . insulin glargine (LANTUS) 100 UNIT/ML  injection Inject 63 Units into the skin at bedtime.    03/28/2020 at 2100  . lisinopril (ZESTRIL) 40 MG tablet Take 40 mg by mouth daily.   Past Week at Unknown time  . lisinopril-hydrochlorothiazide (PRINZIDE,ZESTORETIC) 20-12.5 MG tablet TAKE 1 TABLET BY MOUTH DAILY. 90 tablet 3 Past Week at Unknown time  . loperamide (IMODIUM) 2 MG capsule Take by mouth as needed for diarrhea or loose stools.   Past Week at Unknown time  . lovastatin (MEVACOR) 40 MG tablet Take 1 tablet (40 mg total) by mouth daily. 90 tablet 3 03/28/2020 at 0800  . mineral oil-hydrophilic petrolatum (AQUAPHOR) ointment Apply topically as needed for dry skin.     . pregabalin (LYRICA) 100 MG capsule Take 100 mg by mouth 3 (three) times daily.    03/28/2020 at 2100  . rosuvastatin (CRESTOR) 10 MG tablet Take 10 mg by mouth daily.   Past Week at Unknown time  . Semaglutide (OZEMPIC, 1 MG/DOSE, [place]) Inject into the skin once a week. Mondays   Past Week at Unknown time  . Skin Protectants, Misc. (EUCERIN) cream Apply topically as needed for dry skin.     Marland Kitchen triamcinolone (KENALOG) 0.1 % Apply 1 application topically 2 (two) times daily.   Past Week at Unknown time  . ferrous sulfate 325 (65 FE) MG tablet TAKE 1 TABLET (325 MG TOTAL) BY MOUTH 2 (TWO) TIMES  DAILY. (Patient not taking: Reported on 09/20/2019) 60 tablet 3   . HUMALOG MIX 75/25 KWIKPEN (75-25) 100 UNIT/ML Kwikpen Inject 12 Units into the skin 2 (two) times daily. 12- 15 units BID  11   . Insulin Pen Needle (B-D ULTRAFINE III SHORT PEN) 31G X 8 MM MISC USE 4 TIMES DAILY WITH INSULIN 100 each 3   . montelukast (SINGULAIR) 10 MG tablet TAKE 1 TABLET (10 MG TOTAL) BY MOUTH DAILY. (Patient not taking: Reported on 09/20/2019) 90 tablet 1   . SYNTHROID 50 MCG tablet TAKE 1 TABLET (50 MCG TOTAL) BY MOUTH DAILY. (Patient taking differently: Take 50 mcg by mouth daily before breakfast. ) 90 tablet 3      Allergies  Allergen Reactions  . Kiwi Fragrance [Kiwi Extract] Swelling     Swelling of feet and legs  . Wasp Venom Swelling    Bees Swelling of feet and legs     Past Medical History:  Diagnosis Date  . Abscess of back 2004   lumbar spine abscess, hospitalized about 1 month  . Anemia   . Arthritis    knees, joints  . Asthma    last attack year ago, allergy related  . Car sickness   . Chicken pox   . 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 Depression   . Diabetes mellitus    type 2  . Diabetic neuropathy (HCC)   . GERD (gastroesophageal reflux disease)   . Heart murmur   . History of hiatal hernia   . History of kidney stones   . Hyperlipidemia   . Hypertension   . Hypothyroidism   . Migraines    occasional  . Multiple gastric ulcers   . Neuromuscular disorder (HCC)    neuropathies  . Neuropathy    feet and hands  . Obesity   . Pernicious anemia   . Runny nose    cough/ probably allergies per patient  . Urine incontinence   . Vertigo    occasional    Review of systems:  Otherwise negative.    Physical Exam  Gen: Alert, oriented. Appears stated age.  HEENT: [place]/AT. PERRLA. Lungs: CTA, no wheezes. CV: RR nl S1, S2. Abd: soft, benign, no masses. BS+ Ext: No edema. Pulses 2+    Planned procedures: Proceed with colonoscopy with biopsy of colon. The patient understands the nature of the planned procedure, indications, risks, alternatives and potential complications including but not limited to bleeding, infection, perforation, damage to internal organs and possible oversedation/side effects from anesthesia. The patient agrees and gives consent to proceed.  Please refer to procedure notes for findings, recommendations and patient disposition/instructions.     Dorse Locy K. Tina Blankenship, M.D. Gastroenterology 03/29/2020  2:31 PM

## 2020-03-29 NOTE — Op Note (Signed)
[facility] Gastroenterology Patient Name: Tina Blankenship Procedure Date: 03/29/2020 2:39 PM MRN: [id number] Account #: [id number] Date of Birth: 09/01/1950 Admit Type: Outpatient Age: 69 Room: [facility] ENDO ROOM 2 Gender: Female Note Status: Finalized Procedure:             Colonoscopy Indications:           Functional diarrhea Providers:             Boykin Nearing. Norma Fredrickson MD, MD Referring MD:          No Local Md, MD (Referring MD) Medicines:             Propofol per Anesthesia Complications:         No immediate complications. Procedure:             Pre-Anesthesia Assessment:                        - The risks and benefits of the procedure and the                         sedation options and risks were discussed with the                         patient. All questions were answered and informed                         consent was obtained.                        - Patient identification and proposed procedure were                         verified prior to the procedure by the nurse. The                         procedure was verified in the procedure room.                        - ASA Grade Assessment: III - A patient with severe                         systemic disease.                        - After reviewing the risks and benefits, the patient                         was deemed in satisfactory condition to undergo the                         procedure.                        After obtaining informed consent, the colonoscope was                         passed under direct vision. Throughout the procedure,                         the patient's blood pressure, pulse, and oxygen  saturations were monitored continuously. The                         Colonoscope was introduced through the anus and                         advanced to the the cecum, identified by appendiceal                         orifice and ileocecal valve. The colonoscopy was                          performed without difficulty. The patient tolerated                         the procedure well. The quality of the bowel                         preparation was adequate. The ileocecal valve,                         appendiceal orifice, and rectum were photographed. Findings:      The perianal and digital rectal examinations were normal. Pertinent       negatives include normal sphincter tone.      Normal mucosa was found in the entire colon. Biopsies for histology were       taken with a cold forceps from the random colon for evaluation of       microscopic colitis.      Non-bleeding internal hemorrhoids were found during retroflexion. The       hemorrhoids were Grade I (internal hemorrhoids that do not prolapse).      The exam was otherwise without abnormality. Impression:            - Normal mucosa in the entire examined colon. Biopsied.                        - Non-bleeding internal hemorrhoids.                        - The examination was otherwise normal. Recommendation:        - Patient has a contact number available for                         emergencies. The signs and symptoms of potential                         delayed complications were discussed with the patient.                         Return to normal activities tomorrow. Written                         discharge instructions were provided to the patient.                        - Resume previous diet.                        - Continue present  medications.                        - Await pathology results.                        - Repeat colonoscopy in 10 years for screening                         purposes.                        - Return to GI office PRN.                        - The findings and recommendations were discussed with                         the patient. Procedure Code(s):     --- Professional ---                        [phone], Colonoscopy, flexible; with biopsy, single or                          multiple Diagnosis Code(s):     --- Professional ---                        K59.1, Functional diarrhea                        K64.0, First degree hemorrhoids CPT copyright 2019 [employer]. All rights reserved. The codes documented in this report are preliminary and upon coder review may  be revised to meet current compliance requirements. Stanton Kidney MD, MD 03/29/2020 3:07:57 PM This report has been signed electronically. Number of Addenda: 0 Note Initiated On: 03/29/2020 2:39 PM Scope Withdrawal Time: 0 hours 7 minutes 18 seconds  Total Procedure Duration: 0 hours 14 minutes 6 seconds  Estimated Blood Loss:  Estimated blood loss: none.      [facility]

## 2020-03-29 NOTE — Anesthesia Postprocedure Evaluation (Signed)
Anesthesia Post Note  Patient: Tina Blankenship  Procedure(s) Performed: COLONOSCOPY WITH PROPOFOL (N/A )  Patient location during evaluation: Endoscopy Anesthesia Type: General Level of consciousness: awake and awake and alert Pain management: pain level controlled Vital Signs Assessment: post-procedure vital signs reviewed and stable Respiratory status: spontaneous breathing Cardiovascular status: blood pressure returned to baseline and stable Postop Assessment: no apparent nausea or vomiting Anesthetic complications: no   No complications documented.   Last Vitals:  Vitals:   03/29/20 1327 03/29/20 1506  BP: 134/64 (!) (P) 94/49  Pulse: (!) 101 (P) 97  Resp: 18 (P) 20  Temp: (!) 36.2 C (P) 36.8 C  SpO2: 96% (P) 100%    Last Pain:  Vitals:   03/29/20 1506  TempSrc: (P) Temporal  PainSc:                  Emilio Math

## 2020-03-30 ENCOUNTER — Encounter: Payer: Self-pay | Admitting: Internal Medicine

## 2020-03-31 LAB — SURGICAL PATHOLOGY

## 2020-05-03 ENCOUNTER — Ambulatory Visit
Admission: RE | Admit: 2020-05-03 | Discharge: 2020-05-03 | Disposition: A | Discharge status: Home / Self Care | Payer: Medicare (Managed Care) | Source: Ambulatory Visit | Attending: Family Medicine | Admitting: Family Medicine

## 2020-05-03 ENCOUNTER — Other Ambulatory Visit: Payer: Self-pay

## 2020-05-03 DIAGNOSIS — Z1231 Encounter for screening mammogram for malignant neoplasm of breast: Secondary | ICD-10-CM | POA: Diagnosis not present

## 2020-10-26 ENCOUNTER — Other Ambulatory Visit (HOSPITAL_COMMUNITY): Payer: Self-pay | Admitting: Family Medicine

## 2020-10-26 ENCOUNTER — Other Ambulatory Visit: Payer: Self-pay | Admitting: Family Medicine

## 2020-10-26 DIAGNOSIS — R519 Headache, unspecified: Secondary | ICD-10-CM

## 2020-11-09 ENCOUNTER — Other Ambulatory Visit: Payer: Self-pay

## 2020-11-09 ENCOUNTER — Ambulatory Visit
Admission: RE | Admit: 2020-11-09 | Discharge: 2020-11-09 | Disposition: A | Discharge status: Home / Self Care | Payer: Medicare (Managed Care) | Source: Ambulatory Visit | Attending: Family Medicine | Admitting: Family Medicine

## 2020-11-09 DIAGNOSIS — R519 Headache, unspecified: Secondary | ICD-10-CM | POA: Insufficient documentation

## 2021-02-12 ENCOUNTER — Other Ambulatory Visit: Payer: Self-pay | Admitting: Family Medicine

## 2021-02-12 DIAGNOSIS — Z1231 Encounter for screening mammogram for malignant neoplasm of breast: Secondary | ICD-10-CM

## 2021-05-22 ENCOUNTER — Ambulatory Visit
Admission: RE | Admit: 2021-05-22 | Discharge: 2021-05-22 | Disposition: A | Discharge status: Home / Self Care | Payer: Medicare (Managed Care) | Source: Ambulatory Visit | Attending: Family Medicine | Admitting: Family Medicine

## 2021-05-22 ENCOUNTER — Other Ambulatory Visit: Payer: Self-pay

## 2021-05-22 DIAGNOSIS — Z1231 Encounter for screening mammogram for malignant neoplasm of breast: Secondary | ICD-10-CM | POA: Insufficient documentation

## 2022-05-11 IMAGING — MG DIGITAL SCREENING BILAT W/ TOMO W/ CAD
8 series · 8 of 24 positions shown · non-contrast
Comparison: Previous exam(s).

CLINICAL DATA: Screening.

EXAM:
DIGITAL SCREENING BILATERAL MAMMOGRAM WITH TOMO AND CAD

[L CC synth-2D]
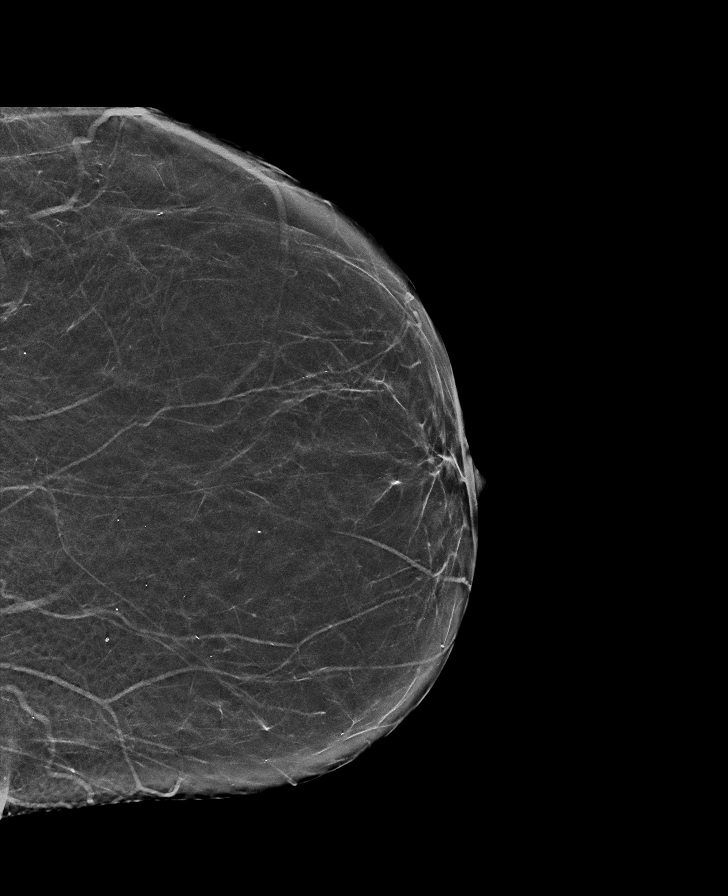

[L MLO synth-2D]
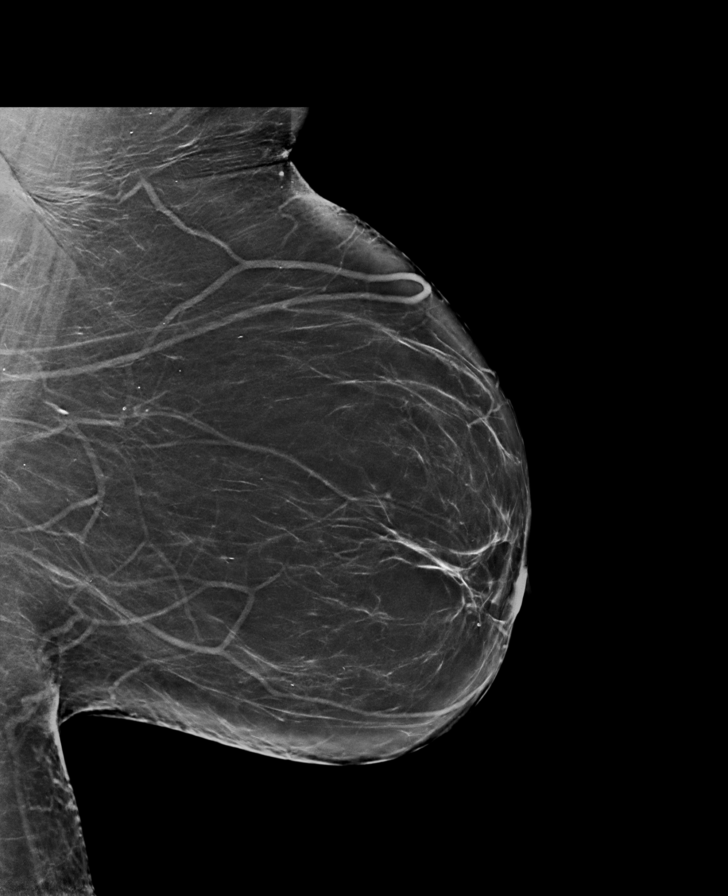

[R MLO synth-2D]
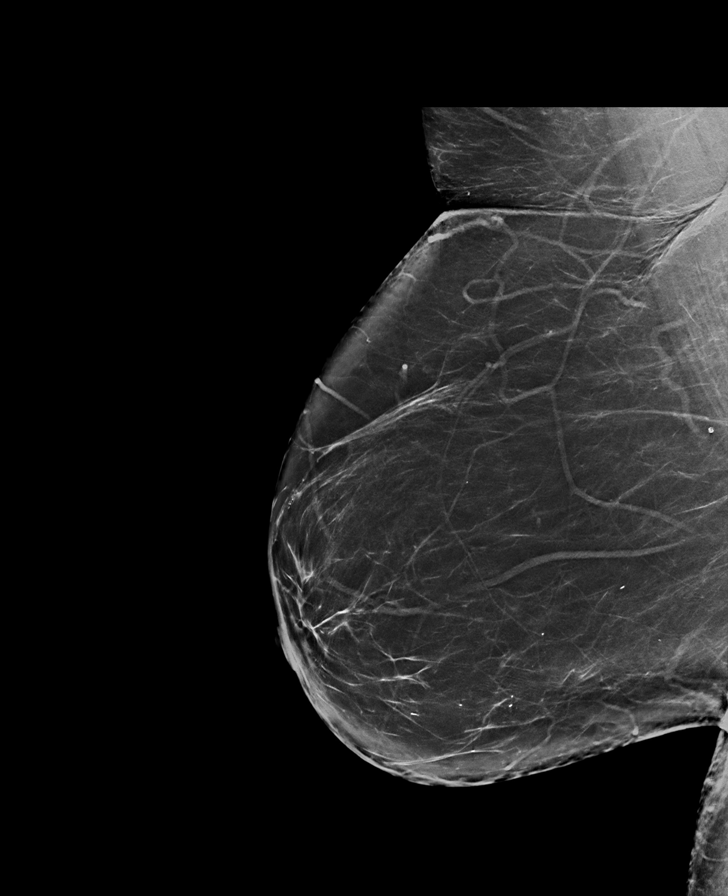

[R CC synth-2D]
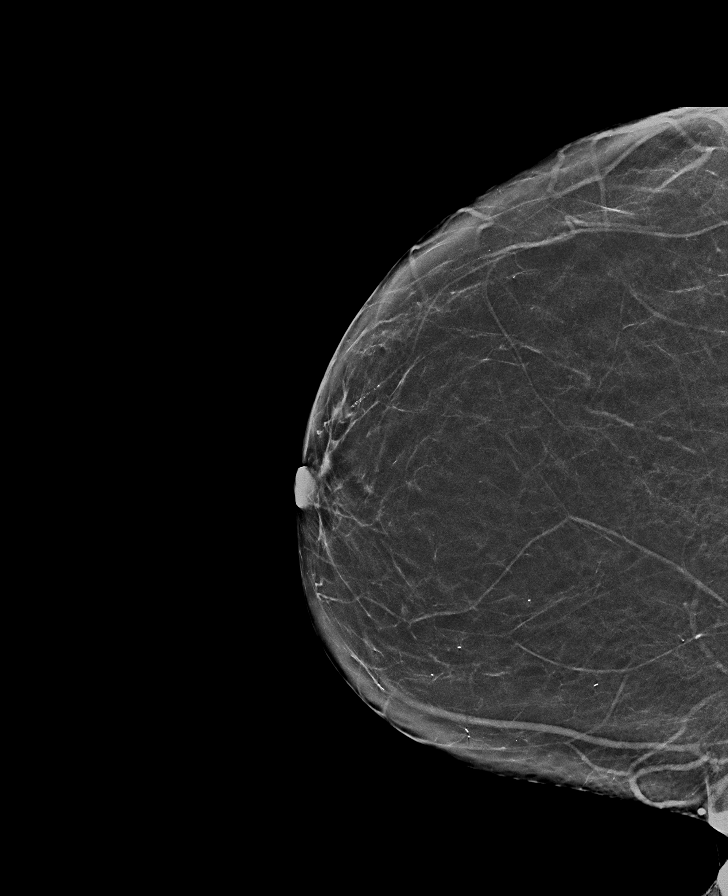

[R CC tomo · tomo slice 31/61.0]
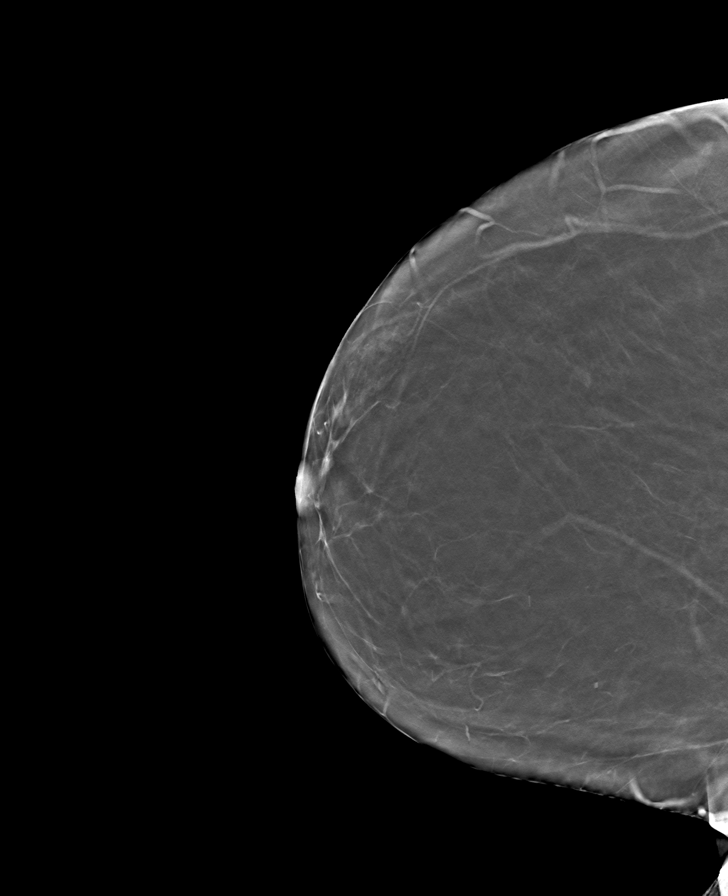

[R MLO tomo · tomo slice 40/79.0]
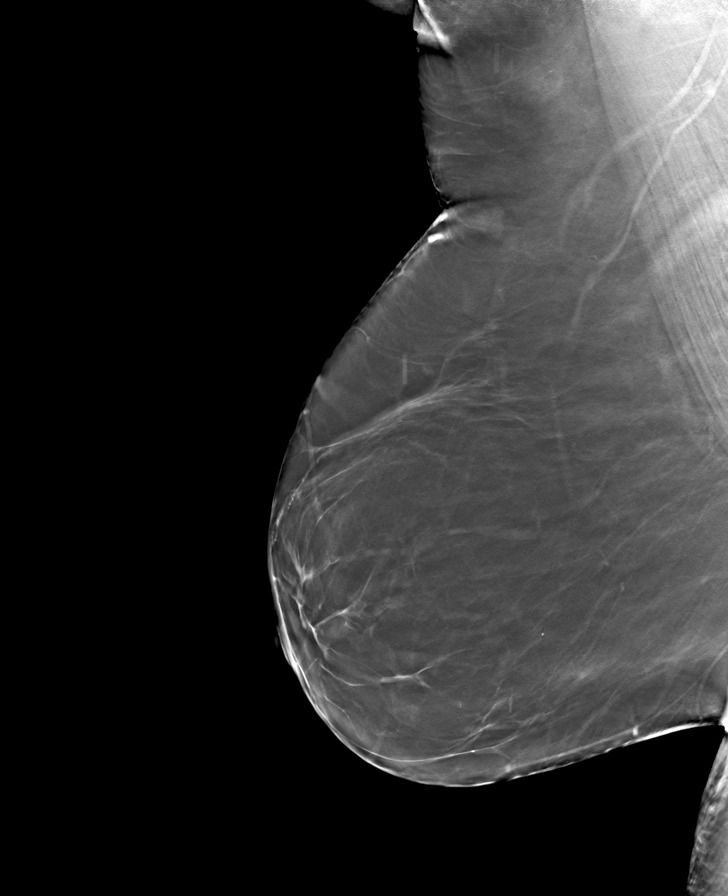

[L MLO tomo · tomo slice 41/80.0]
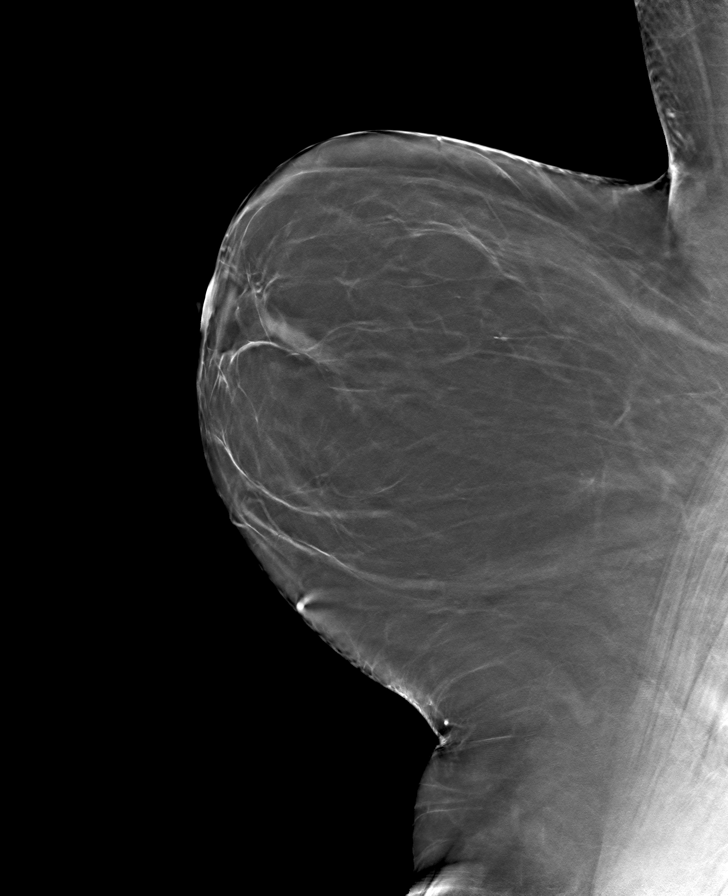

[L CC tomo · tomo slice 31/61.0]
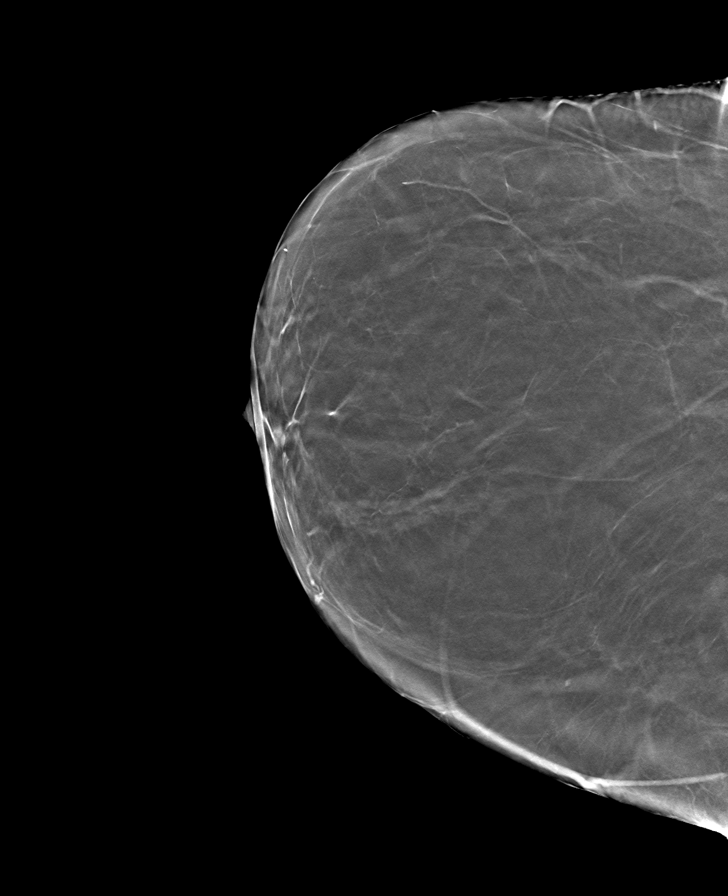

[8 of 24 positions shown; findings below may reference images not displayed]

ACR Breast Density Category b: There are scattered areas of
fibroglandular density.
FINDINGS: There are no findings suspicious for malignancy. Images were
processed with CAD.
IMPRESSION: No mammographic evidence of malignancy. A result letter of this
screening mammogram will be mailed directly to the patient.

RECOMMENDATION:
Screening mammogram in one year. (Code:CN-U-775)

BI-RADS CATEGORY  1: Negative.

## 2022-05-28 ENCOUNTER — Other Ambulatory Visit: Payer: Self-pay | Admitting: Family Medicine

## 2022-05-28 DIAGNOSIS — R519 Headache, unspecified: Secondary | ICD-10-CM

## 2022-05-31 ENCOUNTER — Ambulatory Visit
Admission: RE | Admit: 2022-05-31 | Discharge: 2022-05-31 | Disposition: A | Discharge status: Home / Self Care | Payer: Medicare (Managed Care) | Source: Ambulatory Visit | Attending: Family Medicine | Admitting: Family Medicine

## 2022-05-31 DIAGNOSIS — R519 Headache, unspecified: Secondary | ICD-10-CM | POA: Insufficient documentation

## 2022-08-09 ENCOUNTER — Other Ambulatory Visit: Payer: Self-pay | Admitting: Family Medicine

## 2022-08-09 DIAGNOSIS — Z1231 Encounter for screening mammogram for malignant neoplasm of breast: Secondary | ICD-10-CM

## 2022-08-09 DIAGNOSIS — Z0000 Encounter for general adult medical examination without abnormal findings: Secondary | ICD-10-CM

## 2022-10-09 ENCOUNTER — Ambulatory Visit
Admission: RE | Admit: 2022-10-09 | Discharge: 2022-10-09 | Disposition: A | Discharge status: Home / Self Care | Payer: Medicare (Managed Care) | Source: Ambulatory Visit | Attending: Family Medicine | Admitting: Family Medicine

## 2022-10-09 DIAGNOSIS — Z780 Asymptomatic menopausal state: Secondary | ICD-10-CM | POA: Diagnosis not present

## 2022-10-09 DIAGNOSIS — Z0000 Encounter for general adult medical examination without abnormal findings: Secondary | ICD-10-CM | POA: Insufficient documentation

## 2022-10-09 DIAGNOSIS — Z1231 Encounter for screening mammogram for malignant neoplasm of breast: Secondary | ICD-10-CM

## 2022-10-09 DIAGNOSIS — M8589 Other specified disorders of bone density and structure, multiple sites: Secondary | ICD-10-CM | POA: Diagnosis not present

## 2022-11-17 IMAGING — CT CT HEAD W/O CM
4 series · 13 of 47 positions shown, 15 images · non-contrast
Comparison: No pertinent prior exams available for comparison.

CLINICAL DATA: Provided history: Non intractable headache,
unspecified chronicity pattern, unspecified headache type.
Additional history provided by scanning technologist: Patient
reports fall approximately 1 month ago, hitting right forehead,
headache since that time. Dizziness.

EXAM:
CT HEAD WITHOUT CONTRAST
TECHNIQUE: Contiguous axial images were obtained from the base of the skull
through the vertex without intravenous contrast.

[Series 2: axial st head 5.00 ax · axial · 0.33mm/px · z∈[-443,-348]mm · 6 of 32 slices shown, 8 images]
[im 5/32  brain]
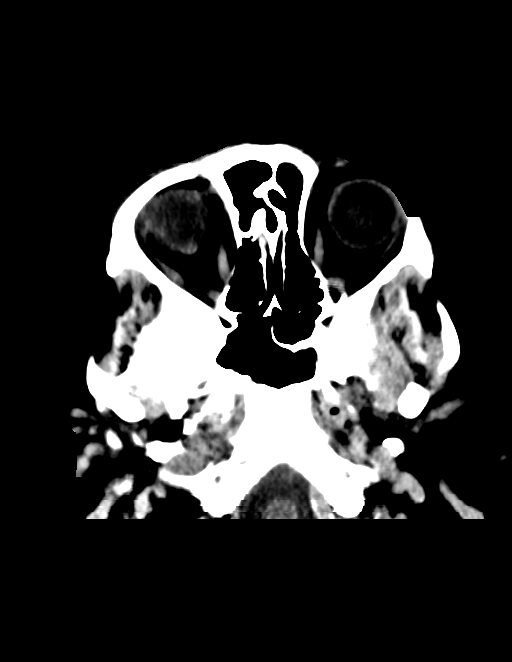
[im 5/32  bone]
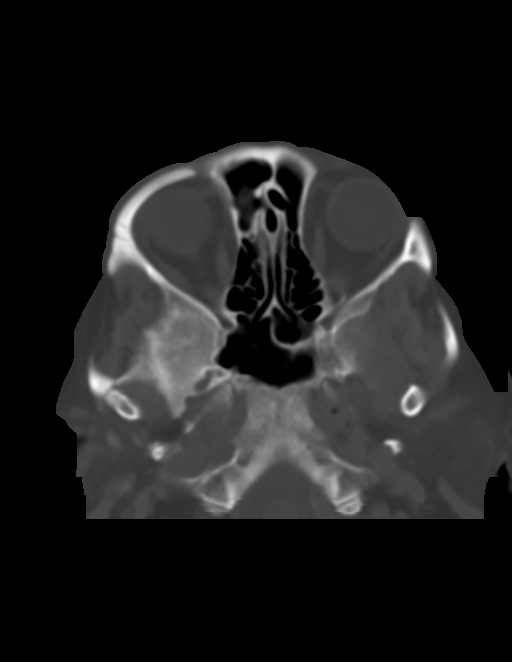
[im 9/32  brain]
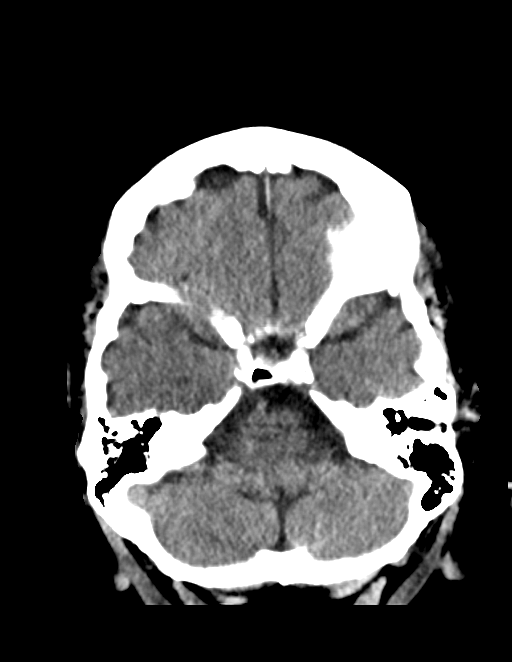
[im 14/32  brain]
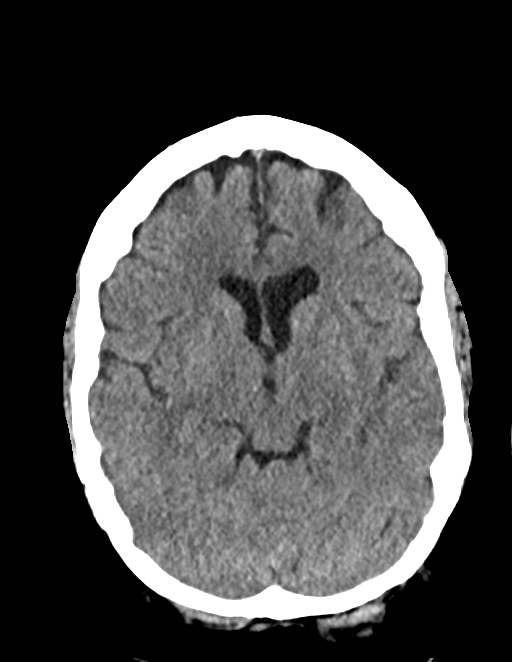
[im 18/32  brain]
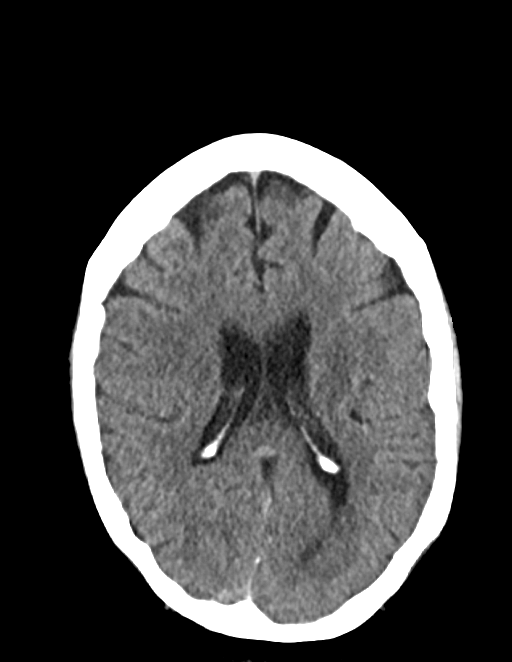
[im 23/32  brain]
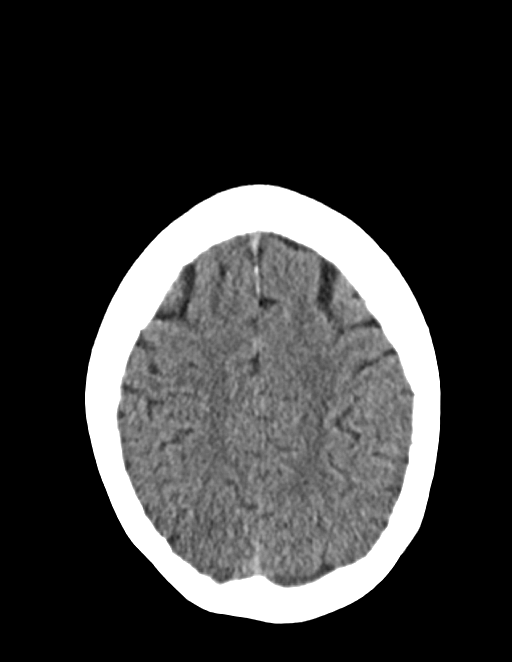
[im 23/32  bone]
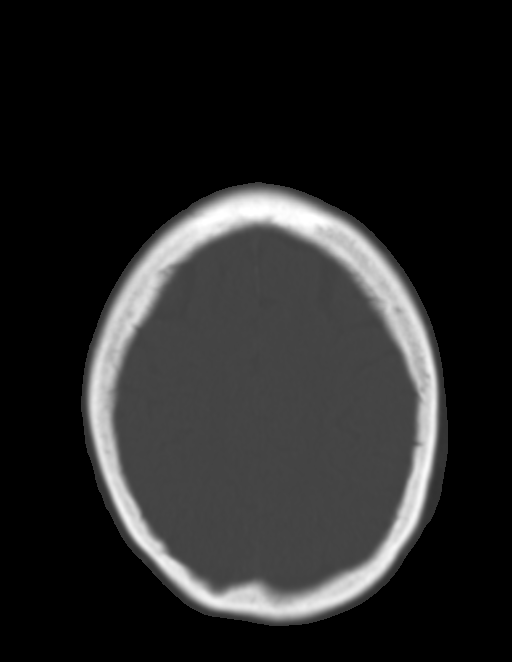
[im 27/32  brain]
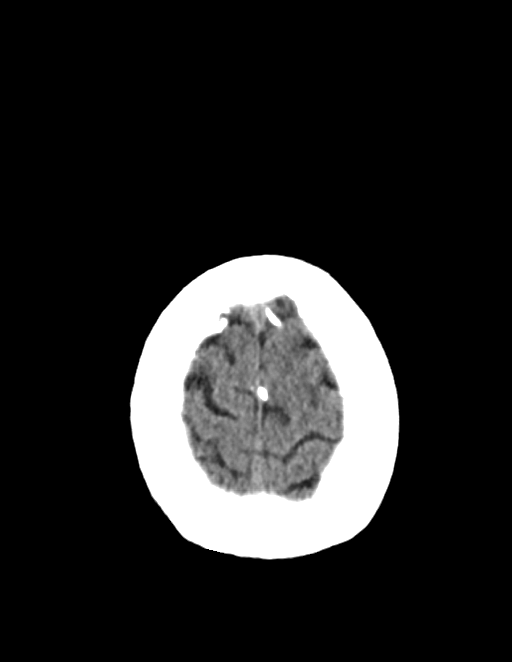

[Series 4: axial bone head 2.00 ax · axial · 0.33mm/px · 1 of 82 slices shown]
[im 8/82  bone]
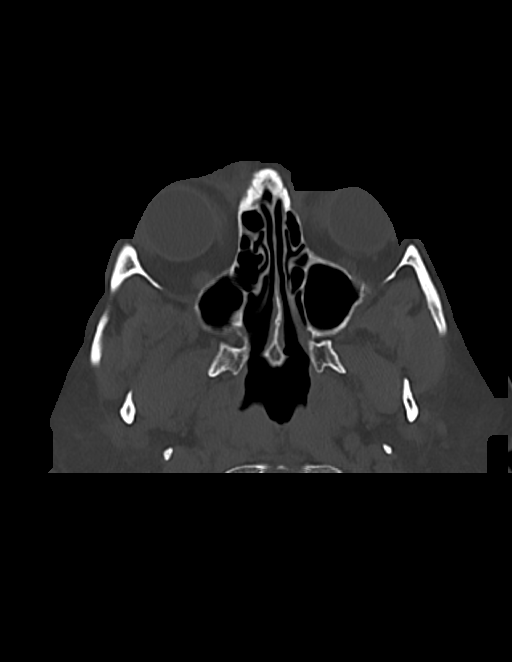

[Series 6: coronals head 3.00 cor · coronal · 0.32mm/px · 3 of 73 slices shown]
[im 28/73  brain]
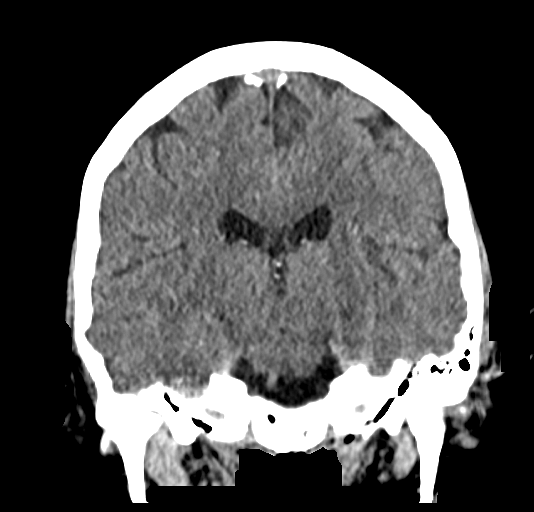
[im 34/73  brain]
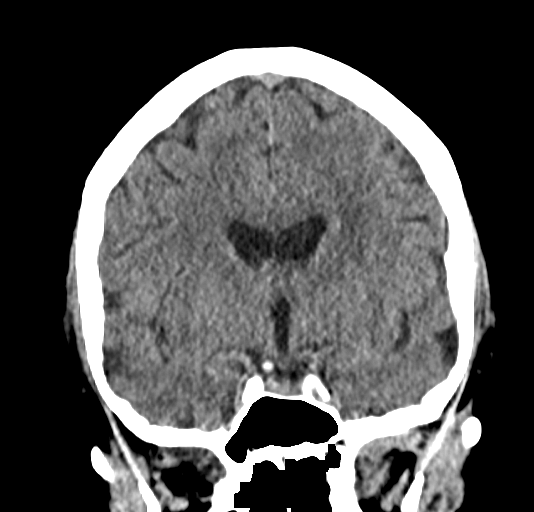
[im 39/73  brain]
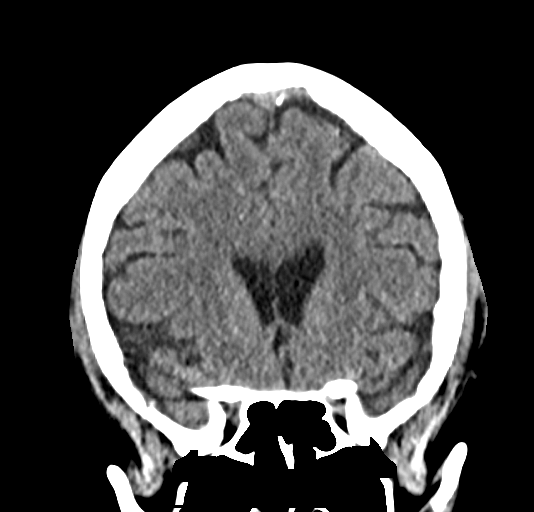

[Series 8: sagittals head 3.00 sag · sagittal · 0.32mm/px · 3 of 57 slices shown]
[im 19/57  brain]
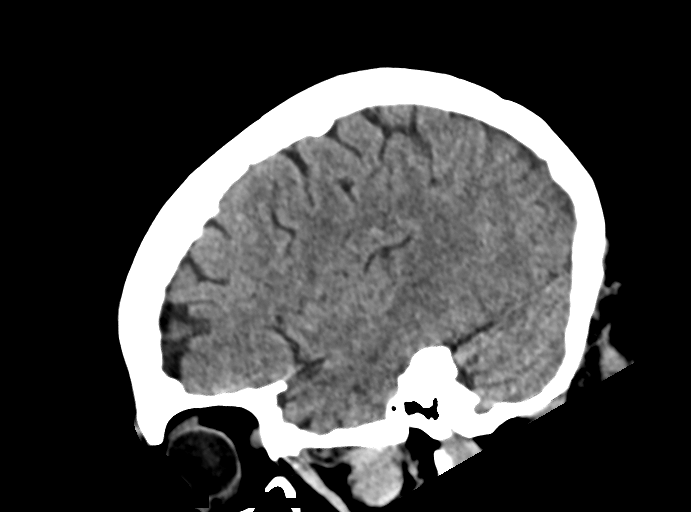
[im 29/57  brain]
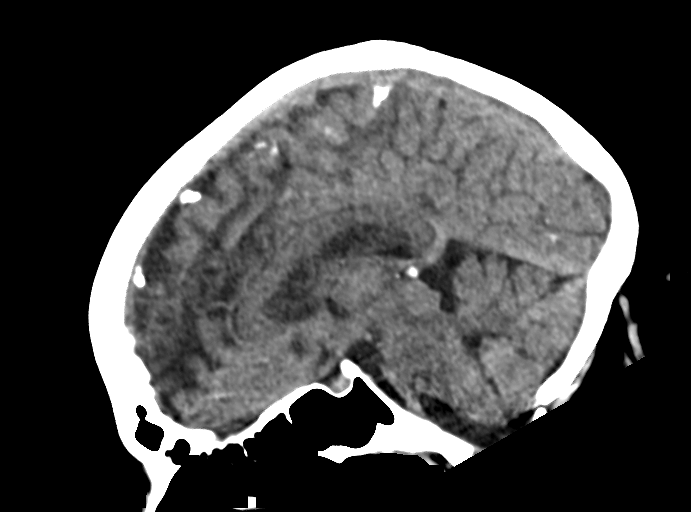
[im 38/57  brain]
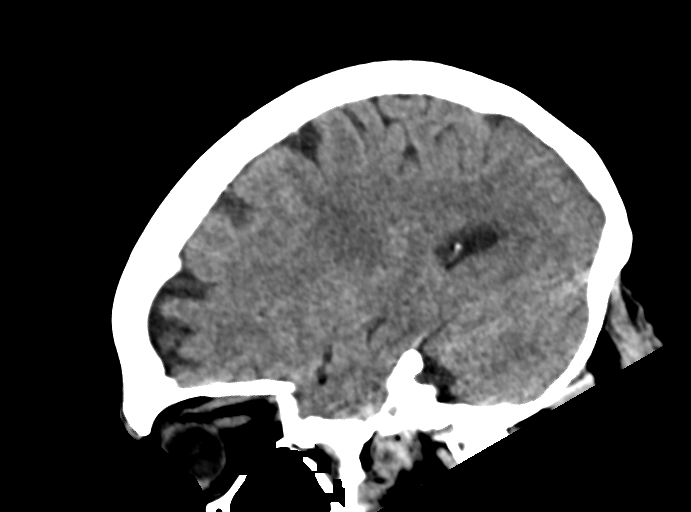

[13 of 47 positions shown; findings below may reference images not displayed]

FINDINGS: Brain:

Cerebral volume is normal for age.

Mild patchy and ill-defined hypoattenuation within the cerebral
white matter, nonspecific but compatible with chronic small vessel
ischemic disease.

There is no acute intracranial hemorrhage.

No demarcated cortical infarct.

No extra-axial fluid collection.

No evidence of an intracranial mass.

No midline shift.

Vascular: No hyperdense vessel.  Atherosclerotic calcifications.

Skull: Normal. Negative for fracture or focal lesion.

Sinuses/Orbits: Visualized orbits show no acute finding. No
significant paranasal sinus disease at the imaged levels.
IMPRESSION: No evidence of acute intracranial abnormality.

Mild chronic small vessel ischemic changes within the cerebral white
matter.

## 2023-02-18 ENCOUNTER — Other Ambulatory Visit: Payer: Self-pay | Admitting: Family Medicine

## 2023-02-18 DIAGNOSIS — Z1231 Encounter for screening mammogram for malignant neoplasm of breast: Secondary | ICD-10-CM

## 2023-04-07 ENCOUNTER — Other Ambulatory Visit: Payer: Self-pay | Admitting: Family Medicine

## 2023-04-07 DIAGNOSIS — R1901 Right upper quadrant abdominal swelling, mass and lump: Secondary | ICD-10-CM

## 2023-04-25 ENCOUNTER — Ambulatory Visit: Payer: Medicare (Managed Care)

## 2023-05-12 ENCOUNTER — Other Ambulatory Visit: Payer: Self-pay | Admitting: Family Medicine

## 2023-05-12 DIAGNOSIS — R1901 Right upper quadrant abdominal swelling, mass and lump: Secondary | ICD-10-CM

## 2023-05-12 DIAGNOSIS — R1011 Right upper quadrant pain: Secondary | ICD-10-CM

## 2023-05-13 ENCOUNTER — Ambulatory Visit
Admission: RE | Admit: 2023-05-13 | Discharge: 2023-05-13 | Disposition: A | Discharge status: Home / Self Care | Payer: Medicare (Managed Care) | Source: Ambulatory Visit | Attending: Family Medicine | Admitting: Family Medicine

## 2023-05-13 DIAGNOSIS — R1901 Right upper quadrant abdominal swelling, mass and lump: Secondary | ICD-10-CM | POA: Diagnosis present

## 2023-05-13 DIAGNOSIS — R1011 Right upper quadrant pain: Secondary | ICD-10-CM | POA: Diagnosis present

## 2023-10-14 ENCOUNTER — Ambulatory Visit
Admission: RE | Admit: 2023-10-14 | Discharge: 2023-10-14 | Disposition: A | Discharge status: Home / Self Care | Payer: Medicare (Managed Care) | Source: Ambulatory Visit | Attending: Family Medicine | Admitting: Family Medicine

## 2023-10-14 DIAGNOSIS — Z1231 Encounter for screening mammogram for malignant neoplasm of breast: Secondary | ICD-10-CM | POA: Insufficient documentation

## 2024-05-13 ENCOUNTER — Emergency Department
Admission: EM | Admit: 2024-05-13 | Discharge: 2024-05-13 | Disposition: A | Discharge status: Home / Self Care | Attending: Emergency Medicine | Admitting: Emergency Medicine

## 2024-05-13 ENCOUNTER — Emergency Department

## 2024-05-13 ENCOUNTER — Other Ambulatory Visit: Payer: Self-pay

## 2024-05-13 DIAGNOSIS — E119 Type 2 diabetes mellitus without complications: Secondary | ICD-10-CM | POA: Insufficient documentation

## 2024-05-13 DIAGNOSIS — W19XXXA Unspecified fall, initial encounter: Secondary | ICD-10-CM | POA: Insufficient documentation

## 2024-05-13 DIAGNOSIS — S9031XA Contusion of right foot, initial encounter: Secondary | ICD-10-CM | POA: Insufficient documentation

## 2024-05-13 DIAGNOSIS — L03115 Cellulitis of right lower limb: Secondary | ICD-10-CM | POA: Diagnosis not present

## 2024-05-13 DIAGNOSIS — L03119 Cellulitis of unspecified part of limb: Secondary | ICD-10-CM

## 2024-05-13 DIAGNOSIS — I10 Essential (primary) hypertension: Secondary | ICD-10-CM | POA: Insufficient documentation

## 2024-05-13 DIAGNOSIS — S99921A Unspecified injury of right foot, initial encounter: Secondary | ICD-10-CM | POA: Diagnosis present

## 2024-05-13 LAB — CBC
HCT: 43.2 % (ref 36.0–46.0)
Hemoglobin: 14.5 g/dL (ref 12.0–15.0)
MCH: 30.9 pg (ref 26.0–34.0)
MCHC: 33.6 g/dL (ref 30.0–36.0)
MCV: 92.1 fL (ref 80.0–100.0)
Platelets: 211 K/uL (ref 150–400)
RBC: 4.69 MIL/uL (ref 3.87–5.11)
RDW: 13.7 % (ref 11.5–15.5)
WBC: 7.9 K/uL (ref 4.0–10.5)
nRBC: 0 % (ref 0.0–0.2)

## 2024-05-13 LAB — BASIC METABOLIC PANEL WITH GFR
Anion gap: 14 (ref 5–15)
BUN: 20 mg/dL (ref 8–23)
CO2: 24 mmol/L (ref 22–32)
Calcium: 9.5 mg/dL (ref 8.9–10.3)
Chloride: 100 mmol/L (ref 98–111)
Creatinine, Ser: 1.6 mg/dL — ABNORMAL HIGH (ref 0.44–1.00)
GFR, Estimated: 34 mL/min — ABNORMAL LOW
Glucose, Bld: 274 mg/dL — ABNORMAL HIGH (ref 70–99)
Potassium: 3.8 mmol/L (ref 3.5–5.1)
Sodium: 137 mmol/L (ref 135–145)

## 2024-05-13 MED ORDER — CLINDAMYCIN HCL 150 MG PO CAPS: 300.0000 mg | ORAL_CAPSULE | Freq: Three times a day (TID) | ORAL | 0 refills | Status: AC

## 2024-05-13 MED ORDER — PIPERACILLIN-TAZOBACTAM 3.375 G IVPB 30 MIN
3.3750 g | Freq: Once | INTRAVENOUS | Status: AC
  Administered 2024-05-13: 3.375 g via INTRAVENOUS
  Filled 2024-05-13: qty 50

## 2024-05-13 NOTE — ED Triage Notes (Signed)
 Pt to ED for wound check on right big toe. States hit toe when fell 2 days ago. Doctor concerned about infection

## 2024-05-13 NOTE — ED Provider Notes (Signed)
 "  [facility] Provider Note    Event Date/Time   First MD Initiated Contact with Patient 05/13/24 1517     (approximate)   History   Wound Check   HPI  Tina Blankenship is a 74 y.o. female history of diabetes, hypertension, gastric ulcers, arthritis, foot surgery presents emergency department after a foot injury.  Patient states 2 days ago she fell injuring the right foot.  States the area was bruised and red but now the redness is spreading and feels warm.  Went to her primary care doctor who was concerned about infection and sent her to the emergency department.  Denies fever or chills.     Physical Exam   Triage Vital Signs: ED Triage Vitals  Encounter Vitals Group     BP 05/13/24 1343 (!) 150/68     Girls Systolic BP Percentile --      Girls Diastolic BP Percentile --      Boys Systolic BP Percentile --      Boys Diastolic BP Percentile --      Pulse Rate 05/13/24 1343 89     Resp 05/13/24 1343 20     Temp 05/13/24 1343 97.7 F (36.5 C)     Temp src --      SpO2 05/13/24 1343 98 %     Weight 05/13/24 1343 250 lb (113.4 kg)     Height 05/13/24 1343 5' 5 (1.651 m)     Head Circumference --      Peak Flow --      Pain Score 05/13/24 1342 6     Pain Loc --      Pain Education --      Exclude from Growth Chart --     Most recent vital signs: Vitals:   05/13/24 1343  BP: (!) 150/68  Pulse: 89  Resp: 20  Temp: 97.7 F (36.5 C)  SpO2: 98%     General: Awake, no distress.  CV:  Good peripheral perfusion. regular rate and  rhythm Resp:  Normal effort. Lungs cta Abd:  No distention.   Other:  Right foot with bruised swollen great toe with open skin area on the dorsum, redness spreading up to the midfoot, neurovascular intact   ED Results / Procedures / Treatments   Labs (all labs ordered are listed, but only abnormal results are displayed) Labs Reviewed  BASIC METABOLIC PANEL WITH GFR - Abnormal; Notable for the following  components:      Result Value   Glucose, Bld 274 (*)    Creatinine, Ser 1.60 (*)    GFR, Estimated 34 (*)    All other components within normal limits  CBC     EKG     RADIOLOGY X-ray of the right foot    PROCEDURES:   Procedures  Critical Care:   Chief Complaint  Patient presents with   Wound Check      MEDICATIONS ORDERED IN ED: Medications  piperacillin -tazobactam (ZOSYN ) IVPB 3.375 g (0 g Intravenous Stopped 05/13/24 1644)     IMPRESSION / MDM / ASSESSMENT AND PLAN / ED COURSE  I reviewed the triage vital signs and the nursing notes.                              Differential diagnosis includes, but is not limited to, fracture, contusion, cellulitis, osteomyelitis, necrosis  Patient's presentation is most consistent with acute illness / injury  with system symptoms.   Medications given: Zosyn  IV  X-ray of the right foot, independent review interpretation by me as being negative for fracture.  Patient is aware of the broken hardware that was also noted on x-ray as that has been there previously.   At this time feel that a dose of IV antibiotics, will do a trial of outpatient treatment.  Patient is agreeable and does not want to stay if possible.  Will have her follow-up with her regular doctor in 2 days.  Photo placed in the chart to be able to assess progress of the cellulitis.  She is to return if worsening.  She is in agreement.  Discharged stable condition.   FINAL CLINICAL IMPRESSION(S) / ED DIAGNOSES   Final diagnoses:  Cellulitis of foot     Rx / DC Orders   ED Discharge Orders          Ordered    clindamycin  (CLEOCIN ) 150 MG capsule  3 times daily        05/13/24 1637             Note:  This document was prepared using Dragon voice recognition software and may include unintentional dictation errors.    Gasper Devere ORN, PA-C 05/13/24 LLEWELLYN Willo Dunnings, MD 05/13/24 [phone]  "
